# Patient Record
Sex: Female | Born: 1950 | Race: White | Hispanic: No | Marital: Married | Smoking: Former smoker
Health system: Southern US, Community
[De-identification: ages and names within clinical notes are randomized; demographics above are authoritative.]

## PROBLEM LIST (undated history)

## (undated) DIAGNOSIS — Z860100 Personal history of colon polyps, unspecified: Secondary | ICD-10-CM

## (undated) DIAGNOSIS — F17200 Nicotine dependence, unspecified, uncomplicated: Secondary | ICD-10-CM

## (undated) DIAGNOSIS — E079 Disorder of thyroid, unspecified: Secondary | ICD-10-CM

## (undated) DIAGNOSIS — F331 Major depressive disorder, recurrent, moderate: Secondary | ICD-10-CM

## (undated) DIAGNOSIS — I10 Essential (primary) hypertension: Secondary | ICD-10-CM

## (undated) DIAGNOSIS — E559 Vitamin D deficiency, unspecified: Secondary | ICD-10-CM

## (undated) HISTORY — DX: Personal history of colon polyps, unspecified: Z86.0100

## (undated) HISTORY — DX: Vitamin D deficiency, unspecified: E55.9

## (undated) HISTORY — DX: Essential (primary) hypertension: I10

## (undated) HISTORY — DX: Nicotine dependence, unspecified, uncomplicated: F17.200

## (undated) HISTORY — DX: Major depressive disorder, recurrent, moderate: F33.1

## (undated) HISTORY — DX: Personal history of colonic polyps: Z86.010

## (undated) HISTORY — DX: Disorder of thyroid, unspecified: E07.9

---

## 2016-11-07 DIAGNOSIS — R748 Abnormal levels of other serum enzymes: Secondary | ICD-10-CM | POA: Insufficient documentation

## 2016-11-08 DIAGNOSIS — I10 Essential (primary) hypertension: Secondary | ICD-10-CM | POA: Insufficient documentation

## 2016-11-08 DIAGNOSIS — E559 Vitamin D deficiency, unspecified: Secondary | ICD-10-CM | POA: Insufficient documentation

## 2017-02-14 DIAGNOSIS — M810 Age-related osteoporosis without current pathological fracture: Secondary | ICD-10-CM | POA: Insufficient documentation

## 2017-07-09 DIAGNOSIS — K649 Unspecified hemorrhoids: Secondary | ICD-10-CM | POA: Insufficient documentation

## 2017-11-21 DIAGNOSIS — R1084 Generalized abdominal pain: Secondary | ICD-10-CM | POA: Insufficient documentation

## 2017-11-21 DIAGNOSIS — R194 Change in bowel habit: Secondary | ICD-10-CM | POA: Insufficient documentation

## 2017-11-21 DIAGNOSIS — K8681 Exocrine pancreatic insufficiency: Secondary | ICD-10-CM | POA: Insufficient documentation

## 2018-03-04 ENCOUNTER — Other Ambulatory Visit: Payer: Self-pay

## 2018-03-04 ENCOUNTER — Ambulatory Visit (INDEPENDENT_AMBULATORY_CARE_PROVIDER_SITE_OTHER): Payer: Medicare Other | Admitting: Psychiatry

## 2018-03-04 ENCOUNTER — Encounter: Payer: Self-pay | Admitting: Psychiatry

## 2018-03-04 VITALS — BP 137/76 | HR 76 | Temp 97.4°F | Ht 58.66 in | Wt 115.0 lb

## 2018-03-04 DIAGNOSIS — F411 Generalized anxiety disorder: Secondary | ICD-10-CM

## 2018-03-04 DIAGNOSIS — F3181 Bipolar II disorder: Secondary | ICD-10-CM

## 2018-03-04 DIAGNOSIS — F4010 Social phobia, unspecified: Secondary | ICD-10-CM | POA: Diagnosis not present

## 2018-03-04 MED ORDER — BUPROPION HCL ER (XL) 300 MG PO TB24: 300.0000 mg | ORAL_TABLET | Freq: Every day | ORAL | 1 refills | Status: DC

## 2018-03-04 MED ORDER — FLUOXETINE HCL 10 MG PO CAPS: 10.0000 mg | ORAL_CAPSULE | Freq: Every day | ORAL | 1 refills | Status: DC

## 2018-03-04 NOTE — Patient Instructions (Signed)
Living With Bipolar Disorder If you have been diagnosed with bipolar disorder, you may be relieved that you now know why you have felt or behaved a certain way. You may also feel overwhelmed about the treatment ahead, how to get the support you need, and how to deal with the condition day-to-day. With care and support, you can learn to manage your symptoms and live with bipolar disorder. How to manage lifestyle changes Managing stress Stress is your body's reaction to life changes and events, both good and bad. Stress can play a major role in bipolar disorder, so it is important to learn how to cope with stress. Some techniques to cope with stress include:  Meditation, muscle relaxation, and breathing exercises.  Exercise. Even a short daily walk can help to lower stress levels.  Getting enough good-quality sleep. Too little sleep can cause mania to start (can trigger mania).  Making a schedule to manage your time. Knowing your daily schedule can help to keep you from feeling overwhelmed by tasks and deadlines.  Spending time on hobbies that you enjoy.  Medicines Your health care provider may suggest certain medicines if he or she feels that they will help improve your condition. Avoid using caffeine, alcohol, and other substances that may prevent your medicines from working properly (may interact). It is also important to:  Talk with your pharmacist or health care provider about all the medicines that you take, their possible side effects, and which medicines are safe to take together.  Make it your goal to take part in all treatment decisions (shared decision-making). Ask about possible side effects of medicines that your health care provider recommends, and tell him or her how you feel about having those side effects. It is best if shared decision-making with your health care provider is part of your total treatment plan.  If you are taking medicines as part of your treatment, do not stop  taking medicines before you ask your health care provider if it is safe to stop. You may need to have the medicine slowly decreased (tapered) over time to decrease the risk of harmful side effects. Relationships Spend time with people that you trust and with whom you feel a sense of understanding and calm. Try to find friends or family members who make you feel safe and can help you control feelings of mania. Consider going to couples counseling, family education classes, or family therapy to:  Educate your loved ones about your condition and offer suggestions about how they can support you.  Help resolve conflicts.  Help develop communication skills in your relationships.  How to recognize changes in your condition Everyone responds differently to treatment for bipolar disorder. Some signs that your condition is improving include:  Leveling of your mood. You may have less anger and excitement about daily activities, and your low moods may not be as bad.  Your symptoms being less intense.  Feeling calm more often.  Thinking clearly.  Not experiencing consequences for extreme behavior.  Feeling like your life is settling down.  Your behavior seeming more normal to you and to other people.  Some signs that your condition may be getting worse include:  Sleep problems.  Moods cycling between deep lows and unusually high (excess) energy.  Extreme emotions.  More anger at loved ones.  Staying away from others (isolating yourself).  A feeling of power or superiority.  Completing a lot of tasks in a very short amount of time.  Unusual thoughts and behaviors.    Suicidal thoughts.  Where to find support Talking to others  Try making a list of the people you may want to tell about your condition, such as the people you trust most.  Plan what you are willing to talk about and what you do not want to discuss. Think about your needs ahead of time, and how your friends and family  members can support you.  Let your loved ones know when they can share advice and when you would just like them to listen.  Give your loved ones information about bipolar disorder, and encourage them to learn about the condition. Finances Not all insurance plans cover mental health care, so it is important to check with your insurance carrier. If paying for co-pays or counseling services is a problem, search for a local or county mental health care center. Public mental health care services may be offered there at a low cost or no cost when you are not able to see a private health care provider. If you are taking medicine for depression, you may be able to get the generic form, which may be less expensive than brand-name medicine. Some makers of prescription medicines also offer help to patients who cannot afford the medicines they need. Follow these instructions at home: Medicines  Take over-the-counter and prescription medicines only as told by your health care provider or pharmacist.  Ask your pharmacist what over-the-counter cold medicines you should avoid. Some medicines can make symptoms worse. General instructions  Ask for support from trusted family members or friends to make sure you stay on track with your treatment.  Keep a journal to write down your daily moods, medicines, sleep habits, and life events. This may help you have more success with your treatment.  Make and follow a routine for daily meal times. Eat healthy foods, such as whole grains, vegetables, and fresh fruit.  Try to go to sleep and wake up around the same time every day.  Keep all follow-up visits as told by your health care provider. This is important. Questions to ask your health care provider:  If you are taking medicines: ? How long do I need to take medicine? ? Are there any long-term side effects of my medicine? ? Are there any alternatives to taking medicine?  How would I benefit from  therapy?  How often should I follow up with a health care provider? Contact a health care provider if:  Your symptoms get worse or they do not get better with treatment. Get help right away if:  You have thoughts about harming yourself or others. If you ever feel like you may hurt yourself or others, or have thoughts about taking your own life, get help right away. You can go to your nearest emergency department or call:  Your local emergency services (911 in the [place]  A suicide crisis helpline, such as the National Suicide Prevention Lifeline at 1-800-273-8255. This is open 24-hours a day.  Summary  Learning ways to deal with stress can help to calm you and may also help your treatment work better.  There is a wide range of medicines that can help to treat bipolar disorder.  Having healthy relationships can help to make your moods more stable.  Contact a health care provider if your symptoms get worse or they do not get better with treatment. This information is not intended to replace advice given to you by your health care provider. Make sure you discuss any questions you have with your   health care provider. Document Released: 01/31/2017 Document Revised: 01/31/2017 Document Reviewed: 01/31/2017 Elsevier Interactive Patient Education  2018 [employer]. Bipolar 2 Disorder Bipolar 2 disorder is a mental health disorder in which a person has episodes of emotional highs (mania) and lows (depression). Bipolar 2 is different from other bipolar disorders because the manic episodes are not as high and do not last as long. This is called hypomania. People with bipolar 2 disorder usually go back and forth between hypomanic and depressive episodes. What are the causes? The cause of this condition is not known. What increases the risk? The following factors may make you more likely to develop this condition:  Having a family member with the disorder.  An imbalance of certain chemicals  in the brain (neurotransmitters).  Stress, such as a death, illness, or financial problems.  Certain conditions that affect the brain or spinal cord (neurologic conditions).  Brain injury (trauma).  Having another mental health disorder, such as: ? Obsessive compulsive disorder. ? Schizophrenia.  What are the signs or symptoms? Symptoms of hypomania include:  Very high self-esteem or self-confidence.  Decreased need for sleep.  Unusual talkativeness or feeling a need to keep talking. Speech may be very fast. It may seem like you cannot stop talking.  Racing thoughts or constant talking, with quick shifts between topics that may or may not be related (flight of ideas).  Decreased ability to focus or concentrate.  Increased purposeful activity, such as work, studies, or social activity.  Increased nonproductive activity. This could be pacing, squirming and fidgeting, or finger and toe tapping.  Impulsive behavior and poor judgment. This may result in high-risk activities, such as having unprotected sex or spending a lot of money.  Symptoms of depression include:  Feeling sad, hopeless, or helpless.  Frequent or uncontrollable crying.  Lack of feeling or caring about anything.  Sleeping too much.  Moving more slowly than usual.  Not being able to enjoy things you used to enjoy.  Desire to be alone all the time.  Feeling guilty or worthless.  Lack of energy or motivation.  Trouble concentrating or remembering.  Trouble making decisions.  Increased appetite.  Thoughts of death or desire to harm yourself.  How is this diagnosed? To diagnose bipolar 2 disorder, your health care provider may ask about your:  Emotional episodes.  Medical history.  Alcohol and drug use. This includes prescription medicines. Certain medical conditions and substances can cause symptoms that seem like bipolar disorder (secondary bipolar disorder).  How is this treated? Bipolar  2 disorder is a long-term (chronic) illness. It is best controlled with ongoing (continuous) treatment rather than being treated only when symptoms occur. Treatment may include:  Psychotherapy. Some forms of talk therapy, such as cognitive-behavioral therapy (CBT), can provide support, education, and guidance.  Coping strategies, such as journaling or relaxation exercises. Relaxation exercises include: ? Yoga. ? Meditation. ? Deep breathing.  Lifestyle changes, such as: ? Limiting alcohol and drug use. ? Exercising regularly. ? Getting plenty of sleep. ? Making healthy eating choices.  Medicine. Medicine can be prescribed by a health care provider who specializes in treating mental disorders (psychiatrist). ? Medicines called mood stabilizers are usually prescribed. ? If symptoms occur even while taking a mood stabilizer, other medicines may be added.  A combination of medicine, talk therapy, and coping methods is the best way to treat this condition. Follow these instructions at home: Activity  Return to your normal activities as told by your health care  provider.  Find activities that you enjoy, and make time to do them.  Exercise regularly as told by your health care provider. Lifestyle  Limit alcohol intake to no more than 1 drink a day for nonpregnant women and 2 drinks a day for men. One drink equals 12 oz of beer, 5 oz of wine, or 1 oz of hard liquor.  Follow a set schedule for eating and sleeping.  Eat a balanced diet that includes fresh fruits and vegetables, whole grains, low-fat dairy, and lean meats.  Get at least 7-8 hours of sleep each night. General instructions  Take over-the-counter and prescription medicines only as told by your health care provider.  Think about joining a support group. Your health care provider may be able to recommend a support group.  Talk with your family and loved ones about your treatment goals and how they can help.  Keep all  follow-up visits as told by your health care provider. This is important. Where to find more information: For more information about bipolar 2 disorder, visit the following websites:  [employer] on Mental Illness: www.nami.org  [place] of Mental Health: http://www.maynard.net/  Contact a health care provider if:  Your symptoms get worse.  You have side effects from your medicine, and they get worse.  You have trouble sleeping.  You have trouble doing daily activities.  You feel unsafe in your surroundings.  You are dealing with substance abuse. Get help right away if:  You have new symptoms.  You have thoughts about harming yourself or others.  You harm yourself. Summary  Bipolar 2 disorder is a mental health disorder in which a person has episodes of hypomania and depression.  Bipolar 2 is best treated through a combination of medicines, talk therapy, and coping strategies.  Talk with your family and loved ones about your treatment goals and how they can help. This information is not intended to replace advice given to you by your health care provider. Make sure you discuss any questions you have with your health care provider. Document Released: 11/06/2016 Document Revised: 11/06/2016 Document Reviewed: 11/06/2016 Elsevier Interactive Patient Education  [employer].
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
## 2018-03-25 ENCOUNTER — Encounter: Payer: Self-pay | Admitting: Psychiatry

## 2018-03-25 ENCOUNTER — Ambulatory Visit (INDEPENDENT_AMBULATORY_CARE_PROVIDER_SITE_OTHER): Payer: Medicare Other | Admitting: Psychiatry

## 2018-03-25 ENCOUNTER — Other Ambulatory Visit: Payer: Self-pay

## 2018-03-25 VITALS — BP 165/73 | HR 72 | Temp 98.9°F | Wt 116.8 lb

## 2018-03-25 DIAGNOSIS — F411 Generalized anxiety disorder: Secondary | ICD-10-CM | POA: Diagnosis not present

## 2018-03-25 DIAGNOSIS — F3181 Bipolar II disorder: Secondary | ICD-10-CM | POA: Diagnosis not present

## 2018-03-25 DIAGNOSIS — F4010 Social phobia, unspecified: Secondary | ICD-10-CM | POA: Diagnosis not present

## 2018-03-25 DIAGNOSIS — F17200 Nicotine dependence, unspecified, uncomplicated: Secondary | ICD-10-CM

## 2018-03-25 MED ORDER — FLUOXETINE HCL 10 MG PO CAPS: 10.0000 mg | ORAL_CAPSULE | Freq: Every day | ORAL | 1 refills | Status: DC

## 2018-03-25 MED ORDER — TRAZODONE HCL 50 MG PO TABS: 25.0000 mg | ORAL_TABLET | Freq: Every evening | ORAL | 2 refills | Status: DC | PRN

## 2018-03-25 NOTE — Patient Instructions (Signed)
Trazodone tablets What is this medicine? TRAZODONE (TRAZ oh done) is used to treat depression. This medicine may be used for other purposes; ask your health care provider or pharmacist if you have questions. COMMON BRAND NAME(S): Desyrel What should I tell my health care provider before I take this medicine? They need to know if you have any of these conditions: -attempted suicide or thinking about it -bipolar disorder -bleeding problems -glaucoma -heart disease, or previous heart attack -irregular heart beat -kidney or liver disease -low levels of sodium in the blood -an unusual or allergic reaction to trazodone, other medicines, foods, dyes or preservatives -pregnant or trying to get pregnant -breast-feeding How should I use this medicine? Take this medicine by mouth with a glass of water. Follow the directions on the prescription label. Take this medicine shortly after a meal or a light snack. Take your medicine at regular intervals. Do not take your medicine more often than directed. Do not stop taking this medicine suddenly except upon the advice of your doctor. Stopping this medicine too quickly may cause serious side effects or your condition may worsen. A special MedGuide will be given to you by the pharmacist with each prescription and refill. Be sure to read this information carefully each time. Talk to your pediatrician regarding the use of this medicine in children. Special care may be needed. Overdosage: If you think you have taken too much of this medicine contact a poison control center or emergency room at once. NOTE: This medicine is only for you. Do not share this medicine with others. What if I miss a dose? If you miss a dose, take it as soon as you can. If it is almost time for your next dose, take only that dose. Do not take double or extra doses. What may interact with this medicine? Do not take this medicine with any of the following medications: -certain medicines  for fungal infections like fluconazole, itraconazole, ketoconazole, posaconazole, voriconazole -cisapride -dofetilide -dronedarone -linezolid -MAOIs like Carbex, Eldepryl, Marplan, Nardil, and Parnate -mesoridazine -methylene blue (injected into a vein) -pimozide -saquinavir -thioridazine -ziprasidone This medicine may also interact with the following medications: -alcohol -antiviral medicines for HIV or AIDS -aspirin and aspirin-like medicines -barbiturates like phenobarbital -certain medicines for blood pressure, heart disease, irregular heart beat -certain medicines for depression, anxiety, or psychotic disturbances -certain medicines for migraine headache like almotriptan, eletriptan, frovatriptan, naratriptan, rizatriptan, sumatriptan, zolmitriptan -certain medicines for seizures like carbamazepine and phenytoin -certain medicines for sleep -certain medicines that treat or prevent blood clots like dalteparin, enoxaparin, warfarin -digoxin -fentanyl -lithium -NSAIDS, medicines for pain and inflammation, like ibuprofen or naproxen -other medicines that prolong the QT interval (cause an abnormal heart rhythm) -rasagiline -supplements like St. John's wort, kava kava, valerian -tramadol -tryptophan This list may not describe all possible interactions. Give your health care provider a list of all the medicines, herbs, non-prescription drugs, or dietary supplements you use. Also tell them if you smoke, drink alcohol, or use illegal drugs. Some items may interact with your medicine. What should I watch for while using this medicine? Tell your doctor if your symptoms do not get better or if they get worse. Visit your doctor or health care professional for regular checks on your progress. Because it may take several weeks to see the full effects of this medicine, it is important to continue your treatment as prescribed by your doctor. Patients and their families should watch out for new  or worsening thoughts of suicide or depression. Also   watch out for sudden changes in feelings such as feeling anxious, agitated, panicky, irritable, hostile, aggressive, impulsive, severely restless, overly excited and hyperactive, or not being able to sleep. If this happens, especially at the beginning of treatment or after a change in dose, call your health care professional. You may get drowsy or dizzy. Do not drive, use machinery, or do anything that needs mental alertness until you know how this medicine affects you. Do not stand or sit up quickly, especially if you are an older patient. This reduces the risk of dizzy or fainting spells. Alcohol may interfere with the effect of this medicine. Avoid alcoholic drinks. This medicine may cause dry eyes and blurred vision. If you wear contact lenses you may feel some discomfort. Lubricating drops may help. See your eye doctor if the problem does not go away or is severe. Your mouth may get dry. Chewing sugarless gum, sucking hard candy and drinking plenty of water may help. Contact your doctor if the problem does not go away or is severe. What side effects may I notice from receiving this medicine? Side effects that you should report to your doctor or health care professional as soon as possible: -allergic reactions like skin rash, itching or hives, swelling of the face, lips, or tongue -elevated mood, decreased need for sleep, racing thoughts, impulsive behavior -confusion -fast, irregular heartbeat -feeling faint or lightheaded, falls -feeling agitated, angry, or irritable -loss of balance or coordination -painful or prolonged erections -restlessness, pacing, inability to keep still -suicidal thoughts or other mood changes -tremors -trouble sleeping -seizures -unusual bleeding or bruising Side effects that usually do not require medical attention (report to your doctor or health care professional if they continue or are bothersome): -change in  sex drive or performance -change in appetite or weight -constipation -headache -muscle aches or pains -nausea This list may not describe all possible side effects. Call your doctor for medical advice about side effects. You may report side effects to FDA at 1-800-FDA-1088. Where should I keep my medicine? Keep out of the reach of children. Store at room temperature between 15 and 30 degrees C (59 to 86 degrees F). Protect from light. Keep container tightly closed. Throw away any unused medicine after the expiration date. NOTE: This sheet is a summary. It may not cover all possible information. If you have questions about this medicine, talk to your doctor, pharmacist, or health care provider.  2018 Elsevier/Gold Standard (2016-03-01 16:57:05)  

## 2018-03-25 NOTE — Progress Notes (Signed)
[facility] MD OP Progress Note  03/25/2018 10:57 AM Marletta Bousquet  MRN:  [id number]  Chief Complaint: ' I am here for follow up." Chief Complaint    Follow-up; Medication Refill     HPI: Julie Harrison is a 67 year old Caucasian female, engaged, retired, lives in [place], has a history of depression, anxiety, hypothyroidism, hypertension, presented to the clinic today for a follow-up visit.   Patient today reports that she is currently doing okay on the current medication regimen.  She is on a small dose of Prozac along with Wellbutrin.  She reports the medications are helping her mood symptoms .  Denies any side effects to the Prozac.  She reports she wants to stay on that dose and does not want any medication readjustment today.  She does struggle with sleep issues.  She however reports it is very occasional and does not happen all the time.  She also is worried about starting any new medications at this time.  Discussed trazodone with patient.  Discussed with patient that she can use it as needed.  She continues to struggle with smoking cigarettes.  She reports she recently started using nicotine lozenges.  She however is worried that it may be giving her side effects like racing heart rate and elevated blood pressure.  Her blood pressure seems to be elevated today.  She however reports it is random and does not happen all the time.  She will reach out to her PMD if it stays elevated.  Discussed with patient about pursuing psychotherapy.  She has an appointment with Ms. Nolon Rod soon.   Visit Diagnosis:    ICD-10-CM   1. Bipolar 2 disorder, major depressive episode (HCC) F31.81   2. GAD (generalized anxiety disorder) F41.1   3. Social anxiety disorder F40.10   4. Tobacco use disorder F17.200     Past Psychiatric History: I have reviewed past psychiatric history from my progress note on 03/04/2018.  Past trials of Viibryd, Brintellix, duloxetine, Pristiq, Zoloft, Lexapro, Latuda,  Prozac-higher dose made her restless.  Past Medical History:  Past Medical History:  Diagnosis Date  . Hypertension   . 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 Personal history of colonic polyps   . Thyroid disease   . Tobacco dependence   . Vitamin D deficiency    History reviewed. No pertinent surgical history.  Family Psychiatric History: I have reviewed family psychiatric history from my progress note on 03/04/2018.  Family History:  Family History  Problem Relation Age of Onset  . Depression Mother   . Alcohol abuse Father   . Alcohol abuse Paternal Uncle   . Depression Maternal Grandmother    Substance abuse history: Denies  Social History: Patient is divorced.  Patient is currently engaged with her boyfriend.  She lives in [place].  She is retired.  She used to work in social services in the past.  She has one daughter living.  That daughter was given away for adoption several years ago since patient had her very young.  However her daughter reached out to her 30 years ago and they have a good relationship with each other at this time. One of her daughters passed away from seizure disorder which happened 3 years ago.  Patient has several grandchildren.  Currently lives with her mother in [place]. Social History   Socioeconomic History  . Marital status: Divorced    Spouse name: Not on file  .  Number of children: 2  . Years of education: Not on file  . Highest education level: Associate degree: occupational, Scientist, product/process development, or vocational program  Occupational History    Comment: retired  Engineer, production  . Financial resource strain: Not hard at all  . Food insecurity:    Worry: Never true    Inability: Never true  . Transportation needs:    Medical: No    Non-medical: No  Tobacco Use  . Smoking status: Current Every Day Smoker    Packs/day: 0.25    Types: Cigarettes  . Smokeless tobacco: Never Used  Substance and  Sexual Activity  . Alcohol use: Yes    Alcohol/week: 1.2 oz    Types: 2 Glasses of wine per week  . Drug use: Yes    Types: Marijuana    Comment: about a month ago  . Sexual activity: Yes  Lifestyle  . Physical activity:    Days per week: 0 days    Minutes per session: 0 min  . Stress: Not at all  Relationships  . Social connections:    Talks on phone: More than three times a week    Gets together: Once a week    Attends religious service: More than 4 times per year    Active member of club or organization: No    Attends meetings of clubs or organizations: Never    Relationship status: Divorced  Other Topics Concern  . Not on file  Social History Narrative  . Not on file    Allergies:  Allergies  Allergen Reactions  . Hydralazine Hcl Itching  . Erythromycin Nausea Only  . Hydrochlorothiazide Itching  . Vortioxetine Nausea Only    Metabolic Disorder Labs: No results found for: HGBA1C, MPG No results found for: PROLACTIN No results found for: CHOL, TRIG, HDL, CHOLHDL, VLDL, LDLCALC No results found for: TSH  Therapeutic Level Labs: No results found for: LITHIUM No results found for: VALPROATE No components found for:  CBMZ  Current Medications: Current Outpatient Medications  Medication Sig Dispense Refill  . alendronate (FOSAMAX) 70 MG tablet Take by mouth.    Marland Kitchen amoxicillin (AMOXIL) 500 MG capsule TAKE 2 CAPSULES BY MOUTH STAT, THEN 1 CAPSULE 3 TIMES DAILY UNTIL GONE  1  . buPROPion (WELLBUTRIN XL) 300 MG 24 hr tablet Take 1 tablet (300 mg total) by mouth daily. 90 tablet 1  . Calcium Carbonate-Vitamin D (OYSTER SHELL CALCIUM 500 + D) 500-125 MG-UNIT TABS Take by mouth.    Marland Kitchen FLUoxetine (PROZAC) 10 MG capsule Take 1 capsule (10 mg total) by mouth daily with breakfast. 90 capsule 1  . levothyroxine (SYNTHROID, LEVOTHROID) 100 MCG tablet Take 100 mcg by mouth daily before breakfast.    . lisinopril (PRINIVIL,ZESTRIL) 5 MG tablet Take 5 mg by mouth daily. for high  blood pressure  1  . loratadine (CLARITIN) 10 MG tablet Take by mouth.    . traZODone (DESYREL) 50 MG tablet Take 0.5-1 tablets (25-50 mg total) by mouth at bedtime as needed for sleep. 30 tablet 2   No current facility-administered medications for this visit.      Musculoskeletal: Strength & Muscle Tone: within normal limits Gait & Station: normal Patient leans: N/A  Psychiatric Specialty Exam: Review of Systems  Psychiatric/Behavioral: The patient is nervous/anxious and has insomnia.   All other systems reviewed and are negative.   Blood pressure (!) 165/73, pulse 72, temperature 98.9 F (37.2 C), temperature source Oral, weight 116 lb 12.8 oz (53 kg).Body  mass index is 23.86 kg/m.  General Appearance: Casual  Eye Contact:  Fair  Speech:  Normal Rate  Volume:  Normal  Mood:  Anxious  Affect:  Congruent  Thought Process:  Goal Directed and Descriptions of Associations: Intact  Orientation:  Full (Time, Place, and Person)  Thought Content: Logical   Suicidal Thoughts:  No  Homicidal Thoughts:  No  Memory:  Immediate;   Fair Recent;   Fair Remote;   Fair  Judgement:  Fair  Insight:  Fair  Psychomotor Activity:  Normal  Concentration:  Concentration: Fair and Attention Span: Fair  Recall:  FiservFair  Fund of Knowledge: Fair  Language: Fair  Akathisia:  No  Handed:  Right  AIMS (if indicated): na  Assets:  Communication Skills Desire for Improvement Social Support  ADL's:  Intact  Cognition: WNL  Sleep:  varies   Screenings:   Assessment and Plan: Julie BeechamCynthia is a 67 year old Caucasian female who has a history of mood symptoms, hypothyroidism, hypertension, presented to the clinic today for a follow-up visit.  Patient has several psychosocial stressors including the death of her daughter from seizure disorder 3 years ago, recent engagement with her boyfriend, history of trauma growing up.  Patient currently is tolerating the medications well and will start psychotherapy  soon.  Plan as noted below.  Plan Bipolar disorder type II Patient completed a mood disorder questionnaire last visit and scored high on the same. We will continue Wellbutrin 300 mg p.o. daily, patient reports she wants to stay on the same Continue Prozac 10 mg p.o. daily. Discussed mood stabilizer, however patient reports she is not ready at this time and want to give these medications more time.  For GAD Prozac 10 mg p.o. daily  For social anxiety disorder Prozac 10 mg p.o. daily Patient will start psychotherapy with Ms. Peacock next week.  Tobacco use disorder She is trying to quit.  She is trying nicotine lozenges at this time.  She also has Wellbutrin.  Patient with elevated blood pressure will monitor her symptoms and follow-up with her PMD.  Follow-up in clinic in 6-8 weeks or sooner if needed.  More than 50 % of the time was spent for psychoeducation and supportive psychotherapy and care coordination.  This note was generated in part or whole with voice recognition software. Voice recognition is usually quite accurate but there are transcription errors that can and very often do occur. I apologize for any typographical errors that were not detected and corrected.       Jomarie LongsSaramma Kathleena Freeman, MD 03/25/2018, 10:57 AM

## 2018-04-07 ENCOUNTER — Ambulatory Visit: Payer: Self-pay | Admitting: Licensed Clinical Social Worker

## 2018-04-18 ENCOUNTER — Other Ambulatory Visit: Payer: Self-pay | Admitting: Psychiatry

## 2018-05-20 ENCOUNTER — Ambulatory Visit: Payer: Medicare Other | Admitting: Psychiatry

## 2018-05-28 ENCOUNTER — Ambulatory Visit: Payer: Medicare Other | Admitting: Psychiatry

## 2018-06-09 ENCOUNTER — Ambulatory Visit (INDEPENDENT_AMBULATORY_CARE_PROVIDER_SITE_OTHER): Payer: Medicare Other | Admitting: Licensed Clinical Social Worker

## 2018-06-09 DIAGNOSIS — F3181 Bipolar II disorder: Secondary | ICD-10-CM

## 2018-06-19 ENCOUNTER — Ambulatory Visit: Payer: Medicare Other | Admitting: Psychiatry

## 2018-06-23 ENCOUNTER — Ambulatory Visit (INDEPENDENT_AMBULATORY_CARE_PROVIDER_SITE_OTHER): Payer: Medicare Other | Admitting: Psychiatry

## 2018-06-23 ENCOUNTER — Other Ambulatory Visit: Payer: Self-pay

## 2018-06-23 ENCOUNTER — Encounter: Payer: Self-pay | Admitting: Psychiatry

## 2018-06-23 VITALS — BP 151/67 | HR 61 | Temp 98.0°F | Wt 117.0 lb

## 2018-06-23 DIAGNOSIS — F17200 Nicotine dependence, unspecified, uncomplicated: Secondary | ICD-10-CM

## 2018-06-23 DIAGNOSIS — F411 Generalized anxiety disorder: Secondary | ICD-10-CM

## 2018-06-23 DIAGNOSIS — F4010 Social phobia, unspecified: Secondary | ICD-10-CM | POA: Diagnosis not present

## 2018-06-23 DIAGNOSIS — F3181 Bipolar II disorder: Secondary | ICD-10-CM | POA: Diagnosis not present

## 2018-06-23 MED ORDER — BUPROPION HCL ER (XL) 150 MG PO TB24: 150.0000 mg | ORAL_TABLET | Freq: Every day | ORAL | 1 refills | Status: DC

## 2018-06-23 MED ORDER — LAMOTRIGINE 25 MG PO TABS
25.0000 mg | ORAL_TABLET | Freq: Every day | ORAL | 1 refills | Status: DC
Start: 2018-06-23 — End: 2018-07-16

## 2018-06-23 NOTE — Patient Instructions (Signed)
Lamotrigine tablets What is this medicine? LAMOTRIGINE (la MOE tri jeen) is used to control seizures in adults and children with epilepsy and Lennox-Gastaut syndrome. It is also used in adults to treat bipolar disorder. This medicine may be used for other purposes; ask your health care provider or pharmacist if you have questions. COMMON BRAND NAME(S): Lamictal What should I tell my health care provider before I take this medicine? They need to know if you have any of these conditions: -a history of depression or bipolar disorder -aseptic meningitis during prior use of lamotrigine -folate deficiency -kidney disease -liver disease -suicidal thoughts, plans, or attempt; a previous suicide attempt by you or a family member -an unusual or allergic reaction to lamotrigine or other seizure medications, other medicines, foods, dyes, or preservatives -pregnant or trying to get pregnant -breast-feeding How should I use this medicine? Take this medicine by mouth with a glass of water. Follow the directions on the prescription label. Do not chew these tablets. If this medicine upsets your stomach, take it with food or milk. Take your doses at regular intervals. Do not take your medicine more often than directed. A special MedGuide will be given to you by the pharmacist with each new prescription and refill. Be sure to read this information carefully each time. Talk to your pediatrician regarding the use of this medicine in children. While this drug may be prescribed for children as young as 2 years for selected conditions, precautions do apply. Overdosage: If you think you have taken too much of this medicine contact a poison control center or emergency room at once. NOTE: This medicine is only for you. Do not share this medicine with others. What if I miss a dose? If you miss a dose, take it as soon as you can. If it is almost time for your next dose, take only that dose. Do not take double or extra  doses. What may interact with this medicine? -carbamazepine -female hormones, including contraceptive or birth control pills -methotrexate -phenobarbital -phenytoin -primidone -pyrimethamine -rifampin -trimethoprim -valproic acid This list may not describe all possible interactions. Give your health care provider a list of all the medicines, herbs, non-prescription drugs, or dietary supplements you use. Also tell them if you smoke, drink alcohol, or use illegal drugs. Some items may interact with your medicine. What should I watch for while using this medicine? Visit your doctor or health care professional for regular checks on your progress. If you take this medicine for seizures, wear a Medic Alert bracelet or necklace. Carry an identification card with information about your condition, medicines, and doctor or health care professional. It is important to take this medicine exactly as directed. When first starting treatment, your dose will need to be adjusted slowly. It may take weeks or months before your dose is stable. You should contact your doctor or health care professional if your seizures get worse or if you have any new types of seizures. Do not stop taking this medicine unless instructed by your doctor or health care professional. Stopping your medicine suddenly can increase your seizures or their severity. Contact your doctor or health care professional right away if you develop a rash while taking this medicine. Rashes may be very severe and sometimes require treatment in the hospital. Deaths from rashes have occurred. Serious rashes occur more often in children than adults taking this medicine. It is more common for these serious rashes to occur during the first 2 months of treatment, but a rash can   occur at any time. You may get drowsy, dizzy, or have blurred vision. Do not drive, use machinery, or do anything that needs mental alertness until you know how this medicine affects you.  To reduce dizzy or fainting spells, do not sit or stand up quickly, especially if you are an older patient. Alcohol can increase drowsiness and dizziness. Avoid alcoholic drinks. If you are taking this medicine for bipolar disorder, it is important to report any changes in your mood to your doctor or health care professional. If your condition gets worse, you get mentally depressed, feel very hyperactive or manic, have difficulty sleeping, or have thoughts of hurting yourself or committing suicide, you need to get help from your health care professional right away. If you are a caregiver for someone taking this medicine for bipolar disorder, you should also report these behavioral changes right away. The use of this medicine may increase the chance of suicidal thoughts or actions. Pay special attention to how you are responding while on this medicine. Your mouth may get dry. Chewing sugarless gum or sucking hard candy, and drinking plenty of water may help. Contact your doctor if the problem does not go away or is severe. Women who become pregnant while using this medicine may enroll in the [place] Antiepileptic Drug Pregnancy Registry by calling 1-888-233-2334. This registry collects information about the safety of antiepileptic drug use during pregnancy. What side effects may I notice from receiving this medicine? Side effects that you should report to your doctor or health care professional as soon as possible: -allergic reactions like skin rash, itching or hives, swelling of the face, lips, or tongue -blurred or double vision -difficulty walking or controlling muscle movements -fever -headache, stiff neck, and sensitivity to light -painful sores in the mouth, eyes, or nose -redness, blistering, peeling or loosening of the skin, including inside the mouth -severe muscle pain -swollen lymph glands -uncontrollable eye movements -unusual bruising or bleeding -unusually weak or  tired -vomiting -worsening of mood, thoughts or actions of suicide or dying -yellowing of the eyes or skin Side effects that usually do not require medical attention (report to your doctor or health care professional if they continue or are bothersome): -diarrhea or constipation -difficulty sleeping -nausea -tremors This list may not describe all possible side effects. Call your doctor for medical advice about side effects. You may report side effects to FDA at 1-800-FDA-1088. Where should I keep my medicine? Keep out of reach of children. Store at room temperature between 15 and 30 degrees C (59 and 86 degrees F). Throw away any unused medicine after the expiration date. NOTE: This sheet is a summary. It may not cover all possible information. If you have questions about this medicine, talk to your doctor, pharmacist, or health care provider.  2018 Elsevier/Gold Standard (2015-11-03 09:29:40)  

## 2018-06-23 NOTE — Progress Notes (Signed)
BH MD OP Progress Note  06/23/2018 5:53 PM Julie Harrison  MRN:  [id number]  Chief Complaint: ' I am here for follow up." Chief Complaint    Follow-up; Medication Refill     HPI: Julie Harrison is a 67 year old Caucasian female, engaged, retired, lives in [place], has a history of depression, anxiety, hypothyroidism, hypertension, presented to the clinic today for a follow-up visit.  Patient today reports she has noticed some hypomanic symptoms like increased energy, multiple goal-directed activity and so on.  She wonders whether it is due to the addition of the Prozac to her Wellbutrin.  She was not ready to start a mood stabilizer last visit however she is more open to it right now.  Discussed readjusting her medication dosage today.  Discussed reducing her Wellbutrin and and adding Lamictal as a mood stabilizer.  Patient agrees with the same.  Patient currently denies any suicidality.  Patient denies any perceptual disturbances.  Patient reports she had some trouble with scheduling her therapy appointments.  Discussed with her that she can be referred to our new therapist and she agrees with plan.  Patient reports she is able to sleep well and does have trazodone as needed for those occasions when she needs it.  She continues to have good social support from her fianc.  She has quit smoking since of her last visit.  She continues to use nicotine lozenges.  Pt with elevated blood pressure today, reports that it may be because of her using nicotine lozenges right before coming into the clinic.  Patient advised to keep a log of her blood pressure and talk to her primary medical doctor. Visit Diagnosis:    ICD-10-CM   1. Bipolar 2 disorder, major depressive episode (HCC) F31.81 buPROPion (WELLBUTRIN XL) 150 MG 24 hr tablet    lamoTRIgine (LAMICTAL) 25 MG tablet  2. GAD (generalized anxiety disorder) F41.1   3. Social anxiety disorder F40.10   4. Tobacco use disorder F17.200    in remission     Past Psychiatric History: Have reviewed past psychiatric history from my progress note on 03/04/2018.  Past trials of Viibryd, Brintellix, duloxetine, Pristiq, Zoloft, Lexapro, Latuda, Prozac.  Past Medical History:  Past Medical History:  Diagnosis Date  . Hypertension   . 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 Personal history of colonic polyps   . Thyroid disease   . Tobacco dependence   . Vitamin D deficiency    History reviewed. No pertinent surgical history.  Family Psychiatric History: Reviewed family psychiatric history from my progress note on 03/04/2018  Family History:  Family History  Problem Relation Age of Onset  . Depression Mother   . Alcohol abuse Father   . Alcohol abuse Paternal Uncle   . Depression Maternal Grandmother     Social History: Reviewed social history from my progress note on 03/04/2018. Social History   Socioeconomic History  . Marital status: Divorced    Spouse name: Not on file  . Number of children: 2  . Years of education: Not on file  . Highest education level: Associate degree: occupational, Scientist, product/process development, or vocational program  Occupational History    Comment: retired  Engineer, production  . Financial resource strain: Not hard at all  . Food insecurity:    Worry: Never true    Inability: Never true  . Transportation needs:    Medical: No    Non-medical: No  Tobacco Use  . Smoking status:  Current Every Day Smoker    Packs/day: 0.25    Types: Cigarettes  . Smokeless tobacco: Never Used  Substance and Sexual Activity  . Alcohol use: Yes    Alcohol/week: 2.0 standard drinks    Types: 2 Glasses of wine per week  . Drug use: Yes    Types: Marijuana    Comment: about a month ago  . Sexual activity: Yes  Lifestyle  . Physical activity:    Days per week: 0 days    Minutes per session: 0 min  . Stress: Not at all  Relationships  . Social connections:    Talks on phone: More than  three times a week    Gets together: Once a week    Attends religious service: More than 4 times per year    Active member of club or organization: No    Attends meetings of clubs or organizations: Never    Relationship status: Divorced  Other Topics Concern  . Not on file  Social History Narrative  . Not on file    Allergies:  Allergies  Allergen Reactions  . Hydralazine Hcl Itching  . Erythromycin Nausea Only  . Hydrochlorothiazide Itching  . Vortioxetine Nausea Only    Metabolic Disorder Labs: No results found for: HGBA1C, MPG No results found for: PROLACTIN No results found for: CHOL, TRIG, HDL, CHOLHDL, VLDL, LDLCALC No results found for: TSH  Therapeutic Level Labs: No results found for: LITHIUM No results found for: VALPROATE No components found for:  CBMZ  Current Medications: Current Outpatient Medications  Medication Sig Dispense Refill  . alendronate (FOSAMAX) 70 MG tablet Take by mouth.    Marland Kitchen amoxicillin (AMOXIL) 500 MG capsule TAKE 2 CAPSULES BY MOUTH STAT, THEN 1 CAPSULE 3 TIMES DAILY UNTIL GONE  1  . Calcium Carbonate-Vitamin D (OYSTER SHELL CALCIUM 500 + D) 500-125 MG-UNIT TABS Take by mouth.    Marland Kitchen FLUoxetine (PROZAC) 10 MG capsule Take 1 capsule (10 mg total) by mouth daily with breakfast. 90 capsule 1  . levothyroxine (SYNTHROID, LEVOTHROID) 100 MCG tablet Take 100 mcg by mouth daily before breakfast.    . lisinopril (PRINIVIL,ZESTRIL) 5 MG tablet Take 5 mg by mouth daily. for high blood pressure  1  . loratadine (CLARITIN) 10 MG tablet Take by mouth.    . traZODone (DESYREL) 50 MG tablet TAKE 1/2 TO 1 TABLET (25-50 MG TOTAL) BY MOUTH AT BEDTIME AS NEEDED FOR SLEEP. 90 tablet 1  . buPROPion (WELLBUTRIN XL) 150 MG 24 hr tablet Take 1 tablet (150 mg total) by mouth daily. 90 tablet 1  . lamoTRIgine (LAMICTAL) 25 MG tablet Take 1 tablet (25 mg total) by mouth daily. 30 tablet 1   No current facility-administered medications for this visit.       Musculoskeletal: Strength & Muscle Tone: within normal limits Gait & Station: normal Patient leans: N/A  Psychiatric Specialty Exam: Review of Systems  Psychiatric/Behavioral: The patient is nervous/anxious.   All other systems reviewed and are negative.   Blood pressure (!) 151/67, pulse 61, temperature 98 F (36.7 C), temperature source Oral, weight 117 lb (53.1 kg).Body mass index is 23.91 kg/m.  General Appearance: Casual  Eye Contact:  Fair  Speech:  Normal Rate  Volume:  Normal  Mood:  Anxious  Affect:  Congruent  Thought Process:  Goal Directed and Descriptions of Associations: Intact  Orientation:  Full (Time, Place, and Person)  Thought Content: Logical   Suicidal Thoughts:  No  Homicidal Thoughts:  No  Memory:  Immediate;   Fair Recent;   Fair Remote;   Fair  Judgement:  Fair  Insight:  Fair  Psychomotor Activity:  Normal  Concentration:  Concentration: Fair and Attention Span: Fair  Recall:  [employer] of Knowledge: Fair  Language: Fair  Akathisia:  No  Handed:  Right  AIMS (if indicated): na  Assets:  Communication Skills Desire for Improvement Social Support  ADL's:  Intact  Cognition: WNL  Sleep:  Fair   Screenings:   Assessment and Plan: Shaena is a 67 year old Caucasian female who has a history of mood symptoms, hypothyroidism, hypertension, presented to the clinic today for a follow-up visit.  Patient with several psychosocial stressors including the death of her daughter from seizure disorder 3 years ago, recent engagement with her boyfriend, history of trauma growing up and so on.  Patient continues to struggle with some mood symptoms and hence discussed medication readjustment.  She will also start psychotherapy.  Plan as noted below.  Plan Bipolar disorder type II Reduce Wellbutrin to 150 mg p.o. daily Continue Prozac 10 mg p.o. daily Add Lamictal 25 mg p.o. Daily.  GAD Prozac 10 mg p.o. daily  For anxiety disorder-social  anxiety Prozac 10 mg p.o. daily Patient to be referred for psychotherapy.  Tobacco use disorder in remission She will continue to use nicotine replacement.  Patient with elevated blood pressure-she will follow-up with her primary medical doctor.  Follow-up in clinic in 3 weeks or sooner if needed.  More than 50 % of the time was spent for psychoeducation and supportive psychotherapy and care coordination.  This note was generated in part or whole with voice recognition software. Voice recognition is usually quite accurate but there are transcription errors that can and very often do occur. I apologize for any typographical errors that were not detected and corrected.       Jomarie Longs, MD 06/23/2018, 5:53 PM

## 2018-06-25 ENCOUNTER — Ambulatory Visit (INDEPENDENT_AMBULATORY_CARE_PROVIDER_SITE_OTHER): Payer: Medicare Other | Admitting: Licensed Clinical Social Worker

## 2018-06-25 ENCOUNTER — Encounter: Payer: Self-pay | Admitting: Licensed Clinical Social Worker

## 2018-06-25 DIAGNOSIS — F411 Generalized anxiety disorder: Secondary | ICD-10-CM
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
## 2018-07-04 ENCOUNTER — Encounter: Payer: Self-pay | Admitting: Licensed Clinical Social Worker

## 2018-07-04 ENCOUNTER — Ambulatory Visit (INDEPENDENT_AMBULATORY_CARE_PROVIDER_SITE_OTHER): Payer: Medicare Other | Admitting: Licensed Clinical Social Worker

## 2018-07-04 DIAGNOSIS — F411 Generalized anxiety disorder: Secondary | ICD-10-CM | POA: Diagnosis not present
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
## 2018-07-11 ENCOUNTER — Ambulatory Visit (INDEPENDENT_AMBULATORY_CARE_PROVIDER_SITE_OTHER): Payer: Medicare Other | Admitting: Licensed Clinical Social Worker

## 2018-07-11 ENCOUNTER — Encounter: Payer: Self-pay | Admitting: Licensed Clinical Social Worker

## 2018-07-11 DIAGNOSIS — F411 Generalized anxiety disorder: Secondary | ICD-10-CM | POA: Diagnosis not present

## 2018-07-11 NOTE — Progress Notes (Addendum)
   THERAPIST PROGRESS NOTE  Session Time: 0900-1000  Participation Level: Active  Behavioral Response: NeatAlertAnxious  Type of Therapy: Individual Therapy  Treatment Goals addressed: Coping  Interventions: Supportive  Summary: Julie Harrison is a 67 y.o. female who presents with Bipolar 2 Disorder and GAD. Pt reports feeling much more able to handle her emotions since starting therapy. She reports improvements in setting boundaries with her boyfriend, and reports she was not previously able to do that.Julie Harrison described her initial hesitancy with beginning a relationship with Julie Harrison, since he knew her daughter who has since passed away. We discussed ways to reframe that experience into something more positive.  She states that her primary concern right now is handling how to navigate a friendship with someone she had distanced herself from, who has recently reappeared. Pt's boyfriend is not comfortable with the friendship, and Julie Harrison is struggling with how to handle this situation. She reports her friend, Julie Harrison, has had ongoing struggles with substances and she feels she's "gotten into situations with him that she wouldn't have otherwise." Pt's boyfriend reports feeling this is a romantic relationship; however, Julie Harrison is gay. Julie Harrison reports feeling very conflicted about cutting Julie Harrison off, but feels she has been enabling him throughout the years.     Suicidal/Homicidal: Pt denies.   Therapist Response: Julie Harrison demonstrates wonderful insight and clear communication skills. She is able to reflect our on sessions and bring things to the next session that she needs assistance with.   Plan: Return again in 1 weeks.  Diagnosis: Axis I: Generalized Anxiety Disorder    Axis II: No diagnosis    Heidi Dach, LCSW 07/11/2018

## 2018-07-14 ENCOUNTER — Ambulatory Visit: Payer: Medicare Other | Admitting: Psychiatry

## 2018-07-16 ENCOUNTER — Other Ambulatory Visit: Payer: Self-pay | Admitting: Psychiatry

## 2018-07-16 DIAGNOSIS — F3181 Bipolar II disorder: Secondary | ICD-10-CM

## 2018-07-18 ENCOUNTER — Ambulatory Visit: Payer: Medicare Other | Admitting: Licensed Clinical Social Worker

## 2018-07-25 ENCOUNTER — Encounter: Payer: Self-pay | Admitting: Licensed Clinical Social Worker

## 2018-07-25 ENCOUNTER — Ambulatory Visit (INDEPENDENT_AMBULATORY_CARE_PROVIDER_SITE_OTHER): Payer: Medicare Other | Admitting: Licensed Clinical Social Worker

## 2018-07-25 DIAGNOSIS — F411 Generalized anxiety disorder: Secondary | ICD-10-CM | POA: Diagnosis not present

## 2018-07-25 NOTE — Progress Notes (Signed)
   THERAPIST PROGRESS NOTE  Session Time: 0900-1000  Participation Level: Active  Behavioral Response: NeatAlertAnxious  Type of Therapy: Individual Therapy  Treatment Goals addressed: Anxiety  Interventions: CBT  Summary: Reika Callanan is a 67 y.o. female who presents with symptoms of anxiety and depression. Aiysha reports she has been having a lot of difficulty setting boundaries in her relationship. She discussed how her partner, Everardo All, goes through her phone and doesn't like when she goes places without him. We discussed how this made her feel, and then discussed what Buster's possible motivations could be. We then discussed various family dynamics that are contributing to her emotional distress around certain situations, primarily her living with her mother. We discussed behavioral patterns from Rachell's past that support her feelings of confinement as a result of her partner's actions. We also processed the conflict about her friend from her past, Iantha Fallen, and how that has added to her emotional state in the last two weeks. River reports still not knowing what she should do, and we discussed how there are not just two options, and that she needs to do what feels right to her. We discussed what that looked like and ways she could arrive at her decision within that relationship.   Suicidal/Homicidal: No  Therapist Response: Haliegh continues to show insight and is able to recognize connections to her childhood and her current situations. Sya continues to show improvements when regulating her emotions as well. We will continue to work on building self-esteem, communication, and setting boundaries in the next weeks.   Plan: Return again in 1 weeks.  Diagnosis: Axis I: Generalized Anxiety Disorder    Axis II: No diagnosis    Heidi Dach, LCSW 07/25/2018

## 2018-07-29 ENCOUNTER — Encounter: Payer: Self-pay | Admitting: Psychiatry

## 2018-07-29 ENCOUNTER — Ambulatory Visit (INDEPENDENT_AMBULATORY_CARE_PROVIDER_SITE_OTHER): Payer: Medicare Other | Admitting: Psychiatry

## 2018-07-29 ENCOUNTER — Other Ambulatory Visit: Payer: Self-pay

## 2018-07-29 VITALS — BP 137/79 | HR 66 | Temp 97.5°F | Wt 119.6 lb

## 2018-07-29 DIAGNOSIS — F17201 Nicotine dependence, unspecified, in remission: Secondary | ICD-10-CM

## 2018-07-29 DIAGNOSIS — F4010 Social phobia, unspecified: Secondary | ICD-10-CM | POA: Diagnosis not present

## 2018-07-29 DIAGNOSIS — F3181 Bipolar II disorder: Secondary | ICD-10-CM | POA: Diagnosis not present

## 2018-07-29 DIAGNOSIS — F411 Generalized anxiety disorder: Secondary | ICD-10-CM | POA: Diagnosis not present

## 2018-07-29 MED ORDER — BUPROPION HCL ER (XL) 300 MG PO TB24
300.0000 mg | ORAL_TABLET | Freq: Every day | ORAL | 1 refills | Status: DC
Start: 2018-07-29 — End: 2019-02-03

## 2018-07-29 NOTE — Progress Notes (Signed)
BH MD OP Progress Note  07/29/2018 12:52 PM [name]  MRN:  [id number]  Chief Complaint:' I am here for follow up visit."  Chief Complaint    Follow-up; Medication Refill     HPI: Julie Harrison is a 67 year old Caucasian female, engaged, retired, lives in [place], has a history of depression, anxiety, hypothyroidism, hypertension, presented to the clinic today for a follow-up visit.  Patient today reports she has not been taking her Lamictal as prescribed.  She reports she took it for a few days and felt like it was making her drowsy as well as nauseous.  She reports she was also worried about being on a new medication and hence decided to stop it.  She reports she eventually went back to her Wellbutrin 300 mg.  She reports she feels much better than her last visit with Clinical research associate.  She reports she has started psychotherapy sessions with Adelina Mings our therapist which has been going well.  She meets her on a weekly basis.  She reports she continues to want to be in therapy and want to see if that will help her prior to adding more medications.  She denies any suicidality or perceptual disturbances.  She reports sleep is good.  She denies any other concerns today. Visit Diagnosis:    ICD-10-CM   1. Bipolar 2 disorder, major depressive episode (HCC) F31.81 buPROPion (WELLBUTRIN XL) 300 MG 24 hr tablet   IMPROVING  2. GAD (generalized anxiety disorder) F41.1   3. Social anxiety disorder F40.10   4. 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  Past trials of Viibryd,brintellix, duloxetine, Pristiq, Zoloft, Lexapro, Latuda, Prozac.  Past Medical History:  Past Medical History:  Diagnosis Date  . Hypertension   . 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 Personal history of colonic polyps   . Thyroid disease   . Tobacco  dependence   . Vitamin D deficiency    History reviewed. No pertinent surgical history.  Family Psychiatric History: Reviewed family psychiatric history from my progress note on 03/04/2018  Family History:  Family History  Problem Relation Age of Onset  . Depression Mother   . Alcohol abuse Father   . Alcohol abuse Paternal Uncle   . Depression Maternal Grandmother     Social History: Reviewed social history from my progress note on 03/04/2018. Social History   Socioeconomic History  . Marital status: Divorced    Spouse name: Not on file  . Number of children: 2  . Years of education: Not on file  . Highest education level: Associate degree: occupational, Scientist, product/process development, or vocational program  Occupational History    Comment: retired  Engineer, production  . Financial resource strain: Not hard at all  . Food insecurity:    Worry: Never true    Inability: Never true  . Transportation needs:    Medical: No    Non-medical: No  Tobacco Use  . Smoking status: Former Smoker    Packs/day: 0.25    Types: Cigarettes    Last attempt to quit: 03/22/2018    Years since quitting: 0.3  . Smokeless tobacco: Never Used  Substance and Sexual Activity  . Alcohol use: Yes    Alcohol/week: 2.0 standard drinks    Types: 2 Glasses of wine per week  . Drug use: Yes    Types: Marijuana  Comment: about a month ago  . Sexual activity: Yes  Lifestyle  . Physical activity:    Days per week: 0 days    Minutes per session: 0 min  . Stress: Not at all  Relationships  . Social connections:    Talks on phone: More than three times a week    Gets together: Once a week    Attends religious service: More than 4 times per year    Active member of club or organization: No    Attends meetings of clubs or organizations: Never    Relationship status: Divorced  Other Topics Concern  . Not on file  Social History Narrative  . Not on file    Allergies:  Allergies  Allergen Reactions  . Hydralazine Hcl  Itching  . Erythromycin Nausea Only  . Hydrochlorothiazide Itching  . Vortioxetine Nausea Only    Metabolic Disorder Labs: No results found for: HGBA1C, MPG No results found for: PROLACTIN No results found for: CHOL, TRIG, HDL, CHOLHDL, VLDL, LDLCALC No results found for: TSH  Therapeutic Level Labs: No results found for: LITHIUM No results found for: VALPROATE No components found for:  CBMZ  Current Medications: Current Outpatient Medications  Medication Sig Dispense Refill  . alendronate (FOSAMAX) 70 MG tablet Take by mouth.    Marland Kitchen amoxicillin (AMOXIL) 500 MG capsule TAKE 2 CAPSULES BY MOUTH STAT, THEN 1 CAPSULE 3 TIMES DAILY UNTIL GONE  1  . Calcium Carbonate-Vitamin D (OYSTER SHELL CALCIUM 500 + D) 500-125 MG-UNIT TABS Take by mouth.    Marland Kitchen FLUoxetine (PROZAC) 10 MG capsule Take 1 capsule (10 mg total) by mouth daily with breakfast. 90 capsule 1  . lisinopril (PRINIVIL,ZESTRIL) 5 MG tablet Take 5 mg by mouth daily. for high blood pressure  1  . loratadine (CLARITIN) 10 MG tablet Take by mouth.    . traZODone (DESYREL) 50 MG tablet TAKE 1/2 TO 1 TABLET (25-50 MG TOTAL) BY MOUTH AT BEDTIME AS NEEDED FOR SLEEP. 90 tablet 1  . buPROPion (WELLBUTRIN XL) 300 MG 24 hr tablet Take 1 tablet (300 mg total) by mouth daily. 90 tablet 1  . levothyroxine (SYNTHROID, LEVOTHROID) 75 MCG tablet TAKE 1 TABLET BY MOUTH EVERY DAY FOR LOW THYROID  2   No current facility-administered medications for this visit.      Musculoskeletal: Strength & Muscle Tone: within normal limits Gait & Station: normal Patient leans: N/A  Psychiatric Specialty Exam: Review of Systems  Psychiatric/Behavioral: The patient is nervous/anxious.   All other systems reviewed and are negative.   Blood pressure 137/79, pulse 66, temperature (!) 97.5 F (36.4 C), temperature source Oral, weight 119 lb 9.6 oz (54.3 kg).Body mass index is 24.44 kg/m.  General Appearance: Casual  Eye Contact:  Fair  Speech:  Normal Rate   Volume:  Normal  Mood:  Anxious  Affect:  Congruent  Thought Process:  Goal Directed and Descriptions of Associations: Intact  Orientation:  Full (Time, Place, and Person)  Thought Content: Logical   Suicidal Thoughts:  No  Homicidal Thoughts:  No  Memory:  Immediate;   Fair Recent;   Fair Remote;   Fair  Judgement:  Fair  Insight:  Fair  Psychomotor Activity:  Normal  Concentration:  Concentration: Fair and Attention Span: Fair  Recall:  [employer] of Knowledge: Fair  Language: Fair  Akathisia:  No  Handed:  Right  AIMS (if indicated): NA  Assets:  Communication Skills Desire for Improvement Social Support  ADL's:  Intact  Cognition: WNL  Sleep:  Fair   Screenings:   Assessment and Plan: Janyra is a 66 year old Caucasian female who has a history of mood symptoms, hypothyroidism, hypertension, presented to the clinic today for a follow-up visit.  Patient with several psychosocial stressors including the death of her daughter from seizure disorder 3 years ago, recent engagement with her boyfriend, history of trauma growing up and so on.  Patient has been noncompliant with Lamictal and reports she wants to continue psychotherapy sessions for now.  She reports improvement in her mood symptoms since her last visit.  Plan as noted below.  Plan Bipolar disorder type II Continue Wellbutrin XL 300 mg p.o. daily.  Her dosage was reduced to 150 mg but she increased the dosage herself to 300 mg.  We will change her Wellbutrin back to 300 mg today Continue Prozac 10 mg p.o. daily. Discontinue Lamictal 25 mg due to noncompliance as well as side effects.  For GAD Prozac 10 mg p.o. Daily. Patient to continue psychotherapy sessions on a weekly basis with our therapist here in clinic.  Social anxiety disorder Continue psychotherapy.  Tobacco use disorder in remission Continue to monitor   Follow-up in clinic in 6 weeks or sooner if needed.  More than 50 % of the time was spent  for psychoeducation and supportive psychotherapy and care coordination.  This note was generated in part or whole with voice recognition software. Voice recognition is usually quite accurate but there are transcription errors that can and very often do occur. I apologize for any typographical errors that were not detected and corrected.       Jomarie Longs, MD 07/29/2018, 12:52 PM

## 2018-08-01 ENCOUNTER — Ambulatory Visit (INDEPENDENT_AMBULATORY_CARE_PROVIDER_SITE_OTHER): Payer: Medicare Other | Admitting: Licensed Clinical Social Worker

## 2018-08-01 ENCOUNTER — Encounter: Payer: Self-pay | Admitting: Licensed Clinical Social Worker

## 2018-08-01 DIAGNOSIS — F3181 Bipolar II disorder: Secondary | ICD-10-CM

## 2018-08-01 DIAGNOSIS — F411 Generalized anxiety disorder: Secondary | ICD-10-CM | POA: Diagnosis not present

## 2018-08-01 NOTE — Progress Notes (Signed)
   THERAPIST PROGRESS NOTE  Session Time:1100-1200  Participation Level: Active  Behavioral Response: CasualAlertDepressed  Type of Therapy: Individual Therapy  Treatment Goals addressed: Coping  Interventions: CBT, Supportive and Reframing  Summary: Julie Harrison is a 67 y.o. female who presents with symptoms related to her diagnosis. Julie Harrison reports feeling depressed over the last week, and stated she had a few crying spells. She reports being non-adherent with medications due to adverse side-effects. We discussed her other hesitations with taking medication, and explored feelings behind those thoughts. Julie Harrison was able to recognize the need for medication after discussing it more at length. We moved on to discuss her relationship with her mother, and how more firm boundaries are needed. We revisited the need for assertive communication in all healthy relationships, and what that would look like if implemented in her relationship with her mother. We discussed Julie Harrison's feelings of social anxiety, and how it is related to her relationship with her mother. Julie Harrison stated when she was growing up, her mother had a lot of parties that Julie Harrison was expected to be a part of. She stated, even at that age, she hated social gatherings. Julie Harrison normalized the idea of not wanting to be overwhelmed in social situations, and we discussed the difficulty in black and white thinking around this issue. Finally, we discussed Julie Harrison's partner's problems with her seeing a therapist, and why he felt that way.   Suicidal/Homicidal: No  Therapist Response: Zalma continues to work hard at [employer] made during her sessions. She puts in work inbetween sessions and comes ready to unpack impactful events. Julie Harrison needs encouragement to utilize assertive communication, but is improving the ability to recognize where assertive communication would be beneficial.  We will continue weekly sessions to address her  emotional regulation via CBT and DBT skills.   Plan: Return again in 1 weeks.  Diagnosis: Axis I: Bipolar, Depressed    Axis II: No diagnosis    Heidi Dach, Julie Harrison 08/01/2018

## 2018-08-08 ENCOUNTER — Ambulatory Visit (INDEPENDENT_AMBULATORY_CARE_PROVIDER_SITE_OTHER): Payer: Medicare Other | Admitting: Licensed Clinical Social Worker

## 2018-08-08 ENCOUNTER — Encounter: Payer: Self-pay | Admitting: Licensed Clinical Social Worker

## 2018-08-08 DIAGNOSIS — F3181 Bipolar II disorder: Secondary | ICD-10-CM | POA: Diagnosis not present

## 2018-08-08 NOTE — Progress Notes (Signed)
   THERAPIST PROGRESS NOTE  Session Time: 1000  Participation Level: Active  Behavioral Response: Well GroomedAlertAnxious  Type of Therapy: Individual Therapy  Treatment Goals addressed: Coping  Interventions: CBT  Summary: Nahal Wanless is a 67 y.o. female who presents with symptoms of depression and anxiety. Jazmarie reports having a good past week and states the depression she was feeling at our last session has lifted, and she thinks "I may be on one of my high swings, but I've also been taking both medications like I'm supposed to." We discussed that, in either case, the result has been positive. Nazli confirmed and stated she always feels better when she is able to "get more done." She discussed things in her relationship are going well, and her and her boyfriend are getting along much better than they had in the previous week. We discussed family dynamics and how they contribute to emotional regulation and difficulty managing emotions. She discussed her weekend plans and how seeing family would be beneficial. Reatha reported she "finally got rid of my ex husband's ashes." She then discussed how she felt relief after spreading his ashes, and stated it felt like a "final goodbye." She then discussed her marriage with him, and stated she felt he was a "psychopath. He was very intelligent and very into the occult. We had a lot of very scary things happen in that house, and I kind of wonder if he was trying to control me through the occult." We discussed this idea briefly, but recognized we would need to discuss this at her next session as we were out of time.   Suicidal/Homicidal: No  Therapist Response: Donnarae presented with a brighter affect than her previous session, and reports returning to baseline in terms of her emotional health. I provided her with though challenging questions to assist her in decreasing anxiety around social situations, which we can discuss at her next session.  Taheera continues to show excellent insight and is able to process appropriately in session.   Plan: Return again in 1 weeks.  Diagnosis: Axis I: Bipolar 2 Disorder, Major Depressive Episode    Axis II: No diagnosis    Heidi Dach, LCSW 08/08/2018

## 2018-08-15 ENCOUNTER — Other Ambulatory Visit: Payer: Self-pay | Admitting: Psychiatry

## 2018-08-15 DIAGNOSIS — F3181 Bipolar II disorder: Secondary | ICD-10-CM

## 2018-08-21 ENCOUNTER — Encounter: Payer: Self-pay | Admitting: Licensed Clinical Social Worker

## 2018-08-21 ENCOUNTER — Ambulatory Visit (INDEPENDENT_AMBULATORY_CARE_PROVIDER_SITE_OTHER): Payer: Medicare Other | Admitting: Licensed Clinical Social Worker

## 2018-08-21 DIAGNOSIS — F411 Generalized anxiety disorder: Secondary | ICD-10-CM

## 2018-08-21 DIAGNOSIS — F3181 Bipolar II disorder: Secondary | ICD-10-CM

## 2018-08-21 NOTE — Progress Notes (Signed)
   THERAPIST PROGRESS NOTE  Session Time: 1000  Participation Level: Active  Behavioral Response: Well GroomedAlertNA  Type of Therapy: Individual Therapy  Treatment Goals addressed: Coping  Interventions: CBT and Supportive  Summary: Julie Harrison is a 67 y.o. female who presents with symptoms related to her diagnosis. Julie Harrison reports doing well since our last session. She reports her mother's boyfriend has been having some medical issues, and at 67 years old, it has had an impact on his health in the long term as well. She reports her mother, who is 36, has been "doing too much and I'm just worrying a lot about her." We discussed the idea of setting boundaries, but also accepting the notion her mother will do what she feels she needs to do with or without the blessing from Julie Harrison. Julie Harrison was able to recognize this idea and understand she does not have control over her mother's actions. Julie Harrison reports going to visit her daughter's friend in prison, and stated being in the prison brought up a lot of memories from her deceased daughter. LCSW helped Julie Harrison reframe negative thoughts regarding her daughter's previous incarceration into more positive thoughts rooted in her daughter's ability to make meaningful friendships wherever she was--even prison.   Suicidal/Homicidal: No   Therapist Response:  We will continue using CBT to reframe thoughts to minimize anxiety and depression symptoms moving forward. Julie Harrison continues to show excellent insight at each session, and does the work inbetween sessions in order to come ready to discuss her progress at each appointment.   Plan: Return again in 1 weeks.  Diagnosis: Axis I: Bipolar 2 Disorder (Major Depressive epsidoe)    Axis II: No diagnosis    Heidi Dach, LCSW 08/21/2018

## 2018-09-04 ENCOUNTER — Encounter: Payer: Self-pay | Admitting: Licensed Clinical Social Worker

## 2018-09-04 ENCOUNTER — Ambulatory Visit (INDEPENDENT_AMBULATORY_CARE_PROVIDER_SITE_OTHER): Payer: Medicare Other | Admitting: Licensed Clinical Social Worker

## 2018-09-04 DIAGNOSIS — F3181 Bipolar II disorder: Secondary | ICD-10-CM

## 2018-09-04 NOTE — Progress Notes (Signed)
   THERAPIST PROGRESS NOTE  Session Time: 1100  Participation Level: Active  Behavioral Response: Well GroomedAlertNA  Type of Therapy: Individual Therapy  Treatment Goals addressed: Coping  Interventions: CBT  Summary: [name] is a 67 y.o. female who presents with symptoms related to her diagnosis. Julie Harrison reports doing well over the last two weeks, and was excited to report she was moving in with her partner. She stated they found a house to rent recently, and they are anticipating moving in on December 1st. Julie Harrison expressed anxiety around the move, but was able to recognize this was related to change in general. She reported being surprised at how her partner had been acting (in a positive way), and was feeling hopeful about the move. She reports continued anxiety around her mother's partner, but reports feeling the move will allow her to create physical space between her and that problem. She reported a recent interaction with family friends, during which she felt very annoyed by the way a man was talking. She was able to recognize this was related to the way her ex-husband spoke to her, and was able to explain her reaction to her partner.   Suicidal/Homicidal: No  Therapist Response: Julie Harrison reports doing well and functioning at baseline over the last two weeks. She reports being able to reframe thoughts in order to reduce anxiety and depression around life events. We will continue to utilize CBT moving forward to manage anxiety and depression symptoms.   Plan: Return again in 2 weeks.  Diagnosis: Axis I: Bipolar 2 Disorder, Major Depressive Episode    Axis II: No diagnosis    Heidi DachKelsey Shatha Hooser, LCSW 09/04/2018

## 2018-09-09 ENCOUNTER — Encounter: Payer: Self-pay | Admitting: Psychiatry

## 2018-09-09 ENCOUNTER — Ambulatory Visit (INDEPENDENT_AMBULATORY_CARE_PROVIDER_SITE_OTHER): Payer: Medicare Other | Admitting: Psychiatry

## 2018-09-09 VITALS — BP 129/73 | HR 71 | Temp 99.0°F | Wt 121.2 lb

## 2018-09-09 DIAGNOSIS — F411 Generalized anxiety disorder: Secondary | ICD-10-CM

## 2018-09-09 DIAGNOSIS — F3181 Bipolar II disorder: Secondary | ICD-10-CM | POA: Diagnosis not present

## 2018-09-09 DIAGNOSIS — F4010 Social phobia, unspecified: Secondary | ICD-10-CM

## 2018-09-09 MED ORDER — FLUOXETINE HCL 10 MG PO CAPS: 20.0000 mg | ORAL_CAPSULE | Freq: Every day | ORAL | 1 refills | Status: DC

## 2018-09-09 NOTE — Progress Notes (Signed)
BH MD  OP Progress Note  09/09/2018 6:55 PM Julie Harrison  MRN:  [id number]  Chief Complaint: ' I am here for follow up." Chief Complaint    Follow-up; Medication Refill     HPI: Julie Harrison is a 67 year old Caucasian female, engaged, retired, lives in [place], has a history of depression, anxiety, hypothyroidism, hypertension, presented to the clinic today for a follow-up visit.  Patient today reports she has noticed some improvement in her mood symptoms on the current medication regimen.  She reports she has been more compliant with her Prozac now and has noticed it as helpful.  Patient continues to be compliant with her Wellbutrin.  She reports she has noticed some flaring up of her IBS recently.  She continues to work with her provider for the same.  Patient reports she is currently going through a lot of psychosocial stressors.  She reports she is going to get married soon and is planning on moving in with her fianc.  Patient reports these are good changes however she feels overwhelmed on and off.  She wonders whether her Prozac dosage needs to be increased or not.  Discussed with patient that Prozac can have an impact on her GI system and can have adverse effects like nausea, diarrhea and so on.  Discussed with patient to increase the dosage to 20 mg and monitor herself closely.  Also discussed the possibility of hypomanic or manic episodes on the Prozac as well as the Wellbutrin combination.  Patient reports she will closely monitor herself and let writer know.  She has not yet started the Lamictal which was prescribed to her previously.  Patient reports talking to her therapist has helped her and she would like to maybe try the medication at some point.  Patient continues to be in psychotherapy sessions with Adelina Mings and reports good benefit from the same. Visit Diagnosis:    ICD-10-CM   1. Bipolar 2 disorder, major depressive episode (HCC) F31.81   2. GAD (generalized anxiety disorder)  F41.1   3. Social anxiety disorder F40.10     Past Psychiatric History: I have reviewed past psychiatric history from my progress note on 03/04/2018.  Past trials of Viibryd, duloxetine, Pristiq, Zoloft, Lexapro, Latuda, Prozac,brintellix.  Past Medical History:  Past Medical History:  Diagnosis Date  . Hypertension   . 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 Personal history of colonic polyps   . Thyroid disease   . Tobacco dependence   . Vitamin D deficiency    No past surgical history on file.  Family Psychiatric History: Have reviewed family psychiatric history from my progress note on 03/04/2018  Family History:  Family History  Problem Relation Age of Onset  . Depression Mother   . Alcohol abuse Father   . Alcohol abuse Paternal Uncle   . Depression Maternal Grandmother     Social History: Reviewed social history from my progress note on 03/04/2018 Social History   Socioeconomic History  . Marital status: Divorced    Spouse name: Not on file  . Number of children: 2  . Years of education: Not on file  . Highest education level: Associate degree: occupational, Scientist, product/process development, or vocational program  Occupational History    Comment: retired  Engineer, production  . Financial resource strain: Not hard at all  . Food insecurity:    Worry: Never true    Inability: Never true  . Transportation needs:    Medical: No  Non-medical: No  Tobacco Use  . Smoking status: Former Smoker    Packs/day: 0.25    Types: Cigarettes    Last attempt to quit: 03/22/2018    Years since quitting: 0.4  . Smokeless tobacco: Never Used  Substance and Sexual Activity  . Alcohol use: Yes    Alcohol/week: 2.0 standard drinks    Types: 2 Glasses of wine per week  . Drug use: Yes    Types: Marijuana    Comment: about a month ago  . Sexual activity: Yes  Lifestyle  . Physical activity:    Days per week: 0 days    Minutes per session: 0 min  .  Stress: Not at all  Relationships  . Social connections:    Talks on phone: More than three times a week    Gets together: Once a week    Attends religious service: More than 4 times per year    Active member of club or organization: No    Attends meetings of clubs or organizations: Never    Relationship status: Divorced  Other Topics Concern  . Not on file  Social History Narrative  . Not on file    Allergies:  Allergies  Allergen Reactions  . Hydralazine Hcl Itching  . Erythromycin Nausea Only  . Hydrochlorothiazide Itching  . Vortioxetine Nausea Only    Metabolic Disorder Labs: No results found for: HGBA1C, MPG No results found for: PROLACTIN No results found for: CHOL, TRIG, HDL, CHOLHDL, VLDL, LDLCALC No results found for: TSH  Therapeutic Level Labs: No results found for: LITHIUM No results found for: VALPROATE No components found for:  CBMZ  Current Medications: Current Outpatient Medications  Medication Sig Dispense Refill  . alendronate (FOSAMAX) 70 MG tablet Take by mouth.    Marland Kitchen amoxicillin (AMOXIL) 500 MG capsule TAKE 2 CAPSULES BY MOUTH STAT, THEN 1 CAPSULE 3 TIMES DAILY UNTIL GONE  1  . buPROPion (WELLBUTRIN XL) 300 MG 24 hr tablet Take 1 tablet (300 mg total) by mouth daily. 90 tablet 1  . Calcium Carbonate-Vitamin D (OYSTER SHELL CALCIUM 500 + D) 500-125 MG-UNIT TABS Take by mouth.    Marland Kitchen FLUoxetine (PROZAC) 10 MG capsule Take 2 capsules (20 mg total) by mouth daily with breakfast. 60 capsule 1  . levothyroxine (SYNTHROID, LEVOTHROID) 75 MCG tablet TAKE 1 TABLET BY MOUTH EVERY DAY FOR LOW THYROID  2  . lisinopril (PRINIVIL,ZESTRIL) 5 MG tablet Take 5 mg by mouth daily. for high blood pressure  1  . loratadine (CLARITIN) 10 MG tablet Take by mouth.    . traZODone (DESYREL) 50 MG tablet TAKE 1/2 TO 1 TABLET (25-50 MG TOTAL) BY MOUTH AT BEDTIME AS NEEDED FOR SLEEP. 90 tablet 1   No current facility-administered medications for this visit.       Musculoskeletal: Strength & Muscle Tone: within normal limits Gait & Station: normal Patient leans: N/A  Psychiatric Specialty Exam: Review of Systems  Psychiatric/Behavioral: The patient is nervous/anxious.   All other systems reviewed and are negative.   Blood pressure 129/73, pulse 71, temperature 99 F (37.2 C), temperature source Oral, weight 121 lb 3.2 oz (55 kg).Body mass index is 24.76 kg/m.  General Appearance: Casual  Eye Contact:  Fair  Speech:  Clear and Coherent  Volume:  Normal  Mood:  Anxious  Affect:  Congruent  Thought Process:  Goal Directed and Descriptions of Associations: Intact  Orientation:  Full (Time, Place, and Person)  Thought Content: Logical   Suicidal  Thoughts:  No  Homicidal Thoughts:  No  Memory:  Immediate;   Fair Recent;   Fair Remote;   Fair  Judgement:  Fair  Insight:  Fair  Psychomotor Activity:  Normal  Concentration:  Concentration: Fair and Attention Span: Fair  Recall:  [employer]  Fund of Knowledge: Fair  Language: Fair  Akathisia:  No  Handed:  Right  AIMS (if indicated): denies tremors, rigidity,stiffness  Assets:  Communication Skills Desire for Improvement Social Support  ADL's:  Intact  Cognition: WNL  Sleep:  Fair   Screenings:   Assessment and Plan: Julie Harrison is a 67 year old Caucasian female who has a history of mood symptoms, hypothyroidism, hypertension, presented to the clinic today for a follow-up visit.  Patient with several psychosocial stressors including the death of her daughter from seizure disorder 3 years ago, recent engagement with her boyfriend, planning to move in with her boyfriend soon, history of trauma growing up and so on.  Patient reports being more compliant with her medications and would like a dosage readjustment since she continues to struggle with some anxiety symptoms.  We will continue medication management as noted below.  Plan Bipolar disorder type II Continue Wellbutrin XL 300 mg p.o.  daily Increase Prozac to 20 mg p.o. Daily.  GAD Prozac 20 mg p.o. daily. Continue psychotherapy with Ms. Heidi DachKelsey Craig.  Social anxiety disorder Continue psychotherapy.  Tobacco use disorder in remission We will continue to monitor closely.  Follow-up in clinic in 1 month or sooner if needed.  More than 50 % of the time was spent for psychoeducation and supportive psychotherapy and care coordination.  This note was generated in part or whole with voice recognition software. Voice recognition is usually quite accurate but there are transcription errors that can and very often do occur. I apologize for any typographical errors that were not detected and corrected.       Jomarie LongsSaramma Bryson Gavia, MD 09/09/2018, 6:55 PM

## 2018-09-15 ENCOUNTER — Other Ambulatory Visit: Payer: Self-pay | Admitting: Psychiatry

## 2018-09-18 ENCOUNTER — Ambulatory Visit: Payer: Medicare Other | Admitting: Licensed Clinical Social Worker

## 2018-09-30 ENCOUNTER — Ambulatory Visit (INDEPENDENT_AMBULATORY_CARE_PROVIDER_SITE_OTHER): Payer: Medicare Other | Admitting: Licensed Clinical Social Worker

## 2018-09-30 ENCOUNTER — Encounter: Payer: Self-pay | Admitting: Licensed Clinical Social Worker

## 2018-09-30 DIAGNOSIS — F3181 Bipolar II disorder: Secondary | ICD-10-CM | POA: Diagnosis not present

## 2018-09-30 NOTE — Progress Notes (Signed)
   THERAPIST PROGRESS NOTE  Session Time: 0900  Participation Level: Active  Behavioral Response: Well GroomedAlertAnxious  Type of Therapy: Individual Therapy  Treatment Goals addressed: Anxiety  Interventions: CBT  Summary: Julie Harrison is a 67 y.o. female who presents with symptoms related to her diagnosis. Julie Harrison reports things have been going well since her last session, and stated she recently moved into a new home with her partner. She reports the move has been stressful, but overall has been a positive change. She states she has had to set a few boundaries with her partner, but has felt comfortable verbalizing those thoughts. For example, her partner had a picture of pt's daughter (from when he knew her) in his bedroom and moved it into the living room. Julie Harrison did not express that this made her feel uncomfortable due to her feeling uncomfortable about his relationship with her daughter, but rather expressed that she did not want it in the living room. Pt's partner then moved it back into his bedroom. LCSW encouraged Julie Harrison to express her feelings to her partner about the nature of her feelings around the photo. Julie Harrison expressed understanding and agreement with this idea. She also reported missing her mother since moving out, but stated it's "a good miss," and she feels less responsible for her mother since she has moved out.    Suicidal/Homicidal: No  Therapist Response: Julie Harrison shows great insight and is able toe express herself appropriately during sessions. She utilizes skills learned in previous sessions and is able to apply them to her daily life. We will continue to utilize CBT moving forward to assist Julie Harrison in managing her anxiety and depression symptoms.   Plan: Return again in 1 weeks.  Diagnosis: Axis I: Bipolar 2 Disorder    Axis II: No diagnosis    Julie DachKelsey Eulia Hatcher, LCSW 09/30/2018

## 2018-10-08 ENCOUNTER — Other Ambulatory Visit: Payer: Self-pay | Admitting: Psychiatry

## 2018-10-10 ENCOUNTER — Ambulatory Visit: Payer: Medicare Other | Admitting: Psychiatry

## 2018-10-21 ENCOUNTER — Ambulatory Visit (INDEPENDENT_AMBULATORY_CARE_PROVIDER_SITE_OTHER): Payer: Medicare Other | Admitting: Licensed Clinical Social Worker

## 2018-10-21 ENCOUNTER — Encounter: Payer: Self-pay | Admitting: Licensed Clinical Social Worker

## 2018-10-21 DIAGNOSIS — F3181 Bipolar II disorder: Secondary | ICD-10-CM

## 2018-10-21 NOTE — Progress Notes (Signed)
   THERAPIST PROGRESS NOTE  Session Time: 1000  Participation Level: Active  Behavioral Response: Well GroomedAlertAnxious  Type of Therapy: Individual Therapy  Treatment Goals addressed: Anxiety  Interventions: CBT  Summary: Julie Harrison is a 68 y.o. female who presents with symptoms related to her diagnosis. Julie Harrison reports things have been going well for her emotionally, but noted she has found herself becoming more irritated with her partner since moving in with him. She reported several instances of him demonstrating poor boundaries with her space, and a lack of understanding where sex is concerned. Julie Harrison encouraged Julie Harrison to set boundaries by utilizing assertive communication at a time when she is not upset. Julie Harrison questioned why she wasn't able to do this better in the moment. Julie Harrison highlighted Julie Harrison's previous relationships where if she noted her feelings or concerns, it was met with emotional abuse. Tonea was conditioned to make herself and feelings smaller in order to avoid a larger conflict. Julie Harrison was able to recognize this concept and agreed that she was carrying some of that into her new relationship with her partner. Julie Harrison normalized those feelings, and assist Julie Harrison in identifying ways she could communicate in an assertive, yet appropriate, way to avoid escalation. Julie Harrison expressed understanding and agreement with this idea.   Suicidal/Homicidal: No  Therapist Response: Julie Harrison is able to speak openly and honestly about her feelings, and is able to recognize concepts presented by Julie Harrison. Julie Harrison is able to use skills learned in previous sessions and is able to understand new skills as well. We will continue to utilize CBT moving forward to assist Julie Harrison in managing her emotional regulation. Additionally, we will continue to work on assertive communication and confidence building.   Plan: Return again in 2 weeks.  Diagnosis: Axis I: Bipolar 2 Disorder, major depressive  episode    Axis II: No diagnosis    Alden Hipp, Julie Harrison 10/21/2018

## 2018-11-04 ENCOUNTER — Encounter: Payer: Self-pay | Admitting: Psychiatry

## 2018-11-04 ENCOUNTER — Other Ambulatory Visit: Payer: Self-pay

## 2018-11-04 ENCOUNTER — Ambulatory Visit (INDEPENDENT_AMBULATORY_CARE_PROVIDER_SITE_OTHER): Payer: Medicare Other | Admitting: Psychiatry

## 2018-11-04 VITALS — BP 121/70 | HR 81 | Temp 97.7°F | Wt 121.4 lb

## 2018-11-04 DIAGNOSIS — F4010 Social phobia, unspecified: Secondary | ICD-10-CM

## 2018-11-04 DIAGNOSIS — F17201 Nicotine dependence, unspecified, in remission: Secondary | ICD-10-CM

## 2018-11-04 DIAGNOSIS — F3181 Bipolar II disorder: Secondary | ICD-10-CM | POA: Diagnosis not present

## 2018-11-04 DIAGNOSIS — F411 Generalized anxiety disorder: Secondary | ICD-10-CM

## 2018-11-04 MED ORDER — FLUOXETINE HCL 10 MG PO CAPS: 10.0000 mg | ORAL_CAPSULE | Freq: Every day | ORAL | 1 refills | Status: DC

## 2018-11-04 NOTE — Progress Notes (Signed)
BH MD OP Progress Note  11/04/2018 5:52 PM Julie Harrison  MRN:  [id number]  Chief Complaint: ' I am here for follow up.' Chief Complaint    Follow-up     HPI:   Julie Harrison is a 68 year old Caucasian female, engaged, retired, lives in [place], has a history of depression, anxiety, hypothyroidism, hypertension, presented to the clinic today for a follow-up visit.   Patient today reports she enjoys the new house that she moved into.  She currently is planning a wedding with her boyfriend and is hopefully going to have it in April.  She reports she looks forward to that.  She reports January is the birthday and  the death anniversary of her daughter which kind of make her distressed on and off.  She however reports her boyfriend is a major support system.  She also has been in therapy with Ms. Julie Harrison which is going well.  Patient reports her medications as helpful.  She reports she is taking the Prozac at a lower dosage of 10 mg since the higher dosage made her restless and shaky.  She reports the current dosage is helpful.  Patient denies any suicidality, homicidality or perceptual disturbances.  She denies any other concerns right now.   Visit Diagnosis:    ICD-10-CM   1. Bipolar 2 disorder, major depressive episode (HCC) F31.81   2. GAD (generalized anxiety disorder) F41.1   3. Social anxiety disorder F40.10   4. 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  Past trials of Viibryd, duloxetine, Pristiq, Zoloft, Lexapro, Latuda, Prozac, Brintellix.  Past Medical History:  Past Medical History:  Diagnosis Date  . Hypertension   . 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 Personal history of colonic polyps   . Thyroid disease   . Tobacco dependence   . Vitamin D deficiency    History reviewed. No pertinent  surgical history.  Family Psychiatric History: Reviewed family psychiatric history from my progress note on 03/04/2018.  Family History:  Family History  Problem Relation Age of Onset  . Depression Mother   . Alcohol abuse Father   . Alcohol abuse Paternal Uncle   . Depression Maternal Grandmother     Social History: Reviewed social history from my progress note on 03/04/2018. Social History   Socioeconomic History  . Marital status: Divorced    Spouse name: Not on file  . Number of children: 2  . Years of education: Not on file  . Highest education level: Associate degree: occupational, Scientist, product/process development, or vocational program  Occupational History    Comment: retired  Engineer, production  . Financial resource strain: Not hard at all  . Food insecurity:    Worry: Never true    Inability: Never true  . Transportation needs:    Medical: No    Non-medical: No  Tobacco Use  . Smoking status: Former Smoker    Packs/day: 0.25    Types: Cigarettes    Last attempt to quit: 03/22/2018    Years since quitting: 0.6  . Smokeless tobacco: Never Used  Substance and Sexual Activity  . Alcohol use: Yes    Alcohol/week: 2.0 standard drinks    Types: 2 Glasses of wine per week  . Drug use: Yes    Types: Marijuana    Comment: about a month ago  . Sexual activity:  Yes  Lifestyle  . Physical activity:    Days per week: 0 days    Minutes per session: 0 min  . Stress: Not at all  Relationships  . Social connections:    Talks on phone: More than three times a week    Gets together: Once a week    Attends religious service: More than 4 times per year    Active member of club or organization: No    Attends meetings of clubs or organizations: Never    Relationship status: Divorced  Other Topics Concern  . Not on file  Social History Narrative  . Not on file    Allergies:  Allergies  Allergen Reactions  . Hydralazine Hcl Itching  . Erythromycin Nausea Only  . Hydrochlorothiazide Itching  .  Vortioxetine Nausea Only    Metabolic Disorder Labs: No results found for: HGBA1C, MPG No results found for: PROLACTIN No results found for: CHOL, TRIG, HDL, CHOLHDL, VLDL, LDLCALC No results found for: TSH  Therapeutic Level Labs: No results found for: LITHIUM No results found for: VALPROATE No components found for:  CBMZ  Current Medications: Current Outpatient Medications  Medication Sig Dispense Refill  . alendronate (FOSAMAX) 70 MG tablet Take by mouth.    Marland Kitchen. amoxicillin (AMOXIL) 500 MG capsule TAKE 2 CAPSULES BY MOUTH STAT, THEN 1 CAPSULE 3 TIMES DAILY UNTIL GONE  1  . buPROPion (WELLBUTRIN XL) 300 MG 24 hr tablet Take 1 tablet (300 mg total) by mouth daily. 90 tablet 1  . Calcium Carbonate-Vitamin D (OYSTER SHELL CALCIUM 500 + D) 500-125 MG-UNIT TABS Take by mouth.    Marland Kitchen. FLUoxetine (PROZAC) 10 MG capsule Take 1 capsule (10 mg total) by mouth daily with breakfast. 90 capsule 1  . levothyroxine (SYNTHROID, LEVOTHROID) 75 MCG tablet TAKE 1 TABLET BY MOUTH EVERY DAY FOR LOW THYROID  2  . lisinopril (PRINIVIL,ZESTRIL) 5 MG tablet Take 5 mg by mouth daily. for high blood pressure  1  . loratadine (CLARITIN) 10 MG tablet Take by mouth.    . traZODone (DESYREL) 50 MG tablet TAKE 1/2 TO 1 TABLET (25-50 MG TOTAL) BY MOUTH AT BEDTIME AS NEEDED FOR SLEEP. 90 tablet 1   No current facility-administered medications for this visit.      Musculoskeletal: Strength & Muscle Tone: within normal limits Gait & Station: normal Patient leans: N/A  Psychiatric Specialty Exam: Review of Systems  Psychiatric/Behavioral: Negative for depression. The patient is not nervous/anxious.   All other systems reviewed and are negative.   Blood pressure 121/70, pulse 81, temperature 97.7 F (36.5 C), temperature source Oral, weight 121 lb 6.4 oz (55.1 kg).Body mass index is 24.8 kg/m.  General Appearance: Casual  Eye Contact:  Fair  Speech:  Clear and Coherent  Volume:  Normal  Mood:  euthymic   Affect:  Congruent  Thought Process:  Goal Directed and Descriptions of Associations: Intact  Orientation:  Full (Time, Place, and Person)  Thought Content: Logical   Suicidal Thoughts:  No  Homicidal Thoughts:  No  Memory:  Immediate;   Fair Recent;   Fair Remote;   Fair  Judgement:  Fair  Insight:  Fair  Psychomotor Activity:  Normal  Concentration:  Concentration: Fair and Attention Span: Fair  Recall:  [employer]Fair  Fund of Knowledge: Fair  Language: Fair  Akathisia:  No  Handed:  Right  AIMS (if indicated):denies tremors, rigidity,stiffness  Assets:  Communication Skills Desire for Improvement Social Support  ADL's:  Intact  Cognition:  WNL  Sleep:  Fair   Screenings:   Assessment and Plan: Melinna is a 68 year old Caucasian female who has a history of mood symptoms, hypothyroidism, hypertension, presented to the clinic today for a follow-up visit.  Patient with several psychosocial stressors including the death of her daughter from seizure disorder, recent engagement with her boyfriend, history of trauma growing up.  Patient reports this week being the birthday and that the anniversary of her daughter is stressful however she continues to have good social support system especially from her boyfriend with whom she stays.  We will continue medications as noted below.  Plan Bipolar disorder type II- improving Continue Wellbutrin extended release 300 mg p.o. daily Continue Prozac at a reduced dosage of 10 mg p.o. daily.  For GAD-improving Prozac 10 mg p.o. daily Continue psychotherapy with Ms. Julie Harrison.  Social anxiety disorder-improving Continue CBT.  Tobacco use disorder in remission We will continue to monitor closely.  Follow-up in clinic in 1 to 2 months or sooner if needed.  I have spent atleast 15 minutes face to face with patient today. More than 50 % of the time was spent for psychoeducation and supportive psychotherapy and care coordination.  This note was  generated in part or whole with voice recognition software. Voice recognition is usually quite accurate but there are transcription errors that can and very often do occur. I apologize for any typographical errors that were not detected and corrected.           Jomarie Longs, MD 11/04/2018, 5:52 PM

## 2018-11-07 ENCOUNTER — Other Ambulatory Visit: Payer: Self-pay | Admitting: Psychiatry

## 2018-11-18 ENCOUNTER — Ambulatory Visit: Payer: Medicare Other | Admitting: Licensed Clinical Social Worker

## 2018-12-02 ENCOUNTER — Encounter: Payer: Self-pay | Admitting: Licensed Clinical Social Worker

## 2018-12-02 ENCOUNTER — Ambulatory Visit (INDEPENDENT_AMBULATORY_CARE_PROVIDER_SITE_OTHER): Payer: Medicare Other | Admitting: Licensed Clinical Social Worker

## 2018-12-02 DIAGNOSIS — F3181 Bipolar II disorder: Secondary | ICD-10-CM

## 2018-12-02 NOTE — Progress Notes (Signed)
   THERAPIST PROGRESS NOTE  Session Time: 1430-1530  Participation Level: Active  Behavioral Response: Well GroomedAlertAnxious  Type of Therapy: Individual Therapy  Treatment Goals addressed: Anxiety  Interventions: Supportive  Summary: Julie Harrison is a 68 y.o. female who presents with continued symptoms of her diagnosis. Julie Harrison reports "a lot has happened since last time I saw you." She stated she and her partner have decided to get married, which has brought on a lot of happiness along with a lot of stress. She reports feeling she is doing a lot of the wedding plans around what others are expecting versus what she actually wants. We discussed ways to determine what she wants and what others are influcing her about. Julie Harrison expressed understanding and agreement. Julie Harrison reported feeling she needs to communicate better with her partner when she is upset. We discussed ways to improve assertive communication, and how to do so within the dynamics of a romantic relationship. Julie Harrison expressed understanding of these ideas as well. Julie Harrison reported having complicated feelings around not having her daughter at her wedding, and what that has brought up for her emotionally. We discussed ways she could incorporate her daughter into the ceremony that would make Julie Harrison feel comfortable.    Suicidal/Homicidal: No  Therapist Response: Julie Harrison continues to work towards her tx goals but has not yet reached them. We will continue to utilize CBT moving forward to assist her in managing her anxiety and depression.   Plan: Return again in 2 weeks.  Diagnosis: Axis I: Bipolar 2 Disorder, Major depressive episode    Axis II: No diagnosis    Heidi Dach, LCSW 12/02/2018

## 2018-12-16 ENCOUNTER — Encounter: Payer: Self-pay | Admitting: Licensed Clinical Social Worker

## 2018-12-16 ENCOUNTER — Ambulatory Visit (INDEPENDENT_AMBULATORY_CARE_PROVIDER_SITE_OTHER): Payer: Medicare Other | Admitting: Licensed Clinical Social Worker

## 2018-12-16 DIAGNOSIS — F3181 Bipolar II disorder: Secondary | ICD-10-CM

## 2018-12-16 NOTE — Progress Notes (Signed)
   THERAPIST PROGRESS NOTE  Session Time: 1100-1155  Participation Level: Active  Behavioral Response: NeatAlertAnxious  Type of Therapy: Individual Therapy  Treatment Goals addressed: Anxiety  Interventions: CBT  Summary: Julie Harrison is a 68 y.o. female who presents with continued symptoms of her diagnosis. Julie Harrison reports things have been going well since our last session. She reports continued stress around planning her wedding, but reports over all she has been able to manage her anxiety in the moment. She reports she has been able to delegate tasks to others in order to decrease her stress around wedding planning. She was able to recognize how helpful others have been, and how that has contributed to her ability to regulate her emotions effectively. Julie Harrison went on to discuss continued stress in her relationship as well. She reports feeling pressured to have sex with her fiance frequently, and states she often does not want to engage in sex at the moment. She reports she told him she did not want to have sex one night last week, "and he acted like an asshole about it and then apologized the next day." We discussed how utilizing assertive communication could assist with this problem, and Julie Harrison validated Julie Harrison's ability to assert herself in that moment. Julie Harrison reported other "small," things in their relationship that bother her, but states she feels she has thrown her focus into planning their wedding in order to not dwell on those problems. Julie Harrison validated Julie Harrison's use of distraction, and encouraged her to recognize those problems will still be there after the wedding--if they are things she can live with, she is fine to address them then. Julie Harrison also asked Julie Harrison to recognize that marriage does not have to be forever if she finds herself unhappy later on. Julie Harrison expressed agreement with that idea, and stated sometimes it is difficult to remember.   Suicidal/Homicidal: No  Therapist  Response: Julie Harrison continues to work towards her tx goals but has not yet reached them. She is able to utilize skills discussed in sessions to manage her emotions in daily life. We will continue to utilize CBT moving forward to manage anxiety and depression symptoms.   Plan: Return again in 2 weeks.  Diagnosis: Axis I: Bipolar 2 Disorder, major depressive episode    Axis II: No diagnosis    Julie Harrison, Julie Harrison 12/16/2018

## 2019-01-08 ENCOUNTER — Ambulatory Visit: Payer: Medicare Other | Admitting: Psychiatry

## 2019-01-08 ENCOUNTER — Ambulatory Visit: Payer: Medicare Other | Admitting: Licensed Clinical Social Worker

## 2019-01-29 ENCOUNTER — Other Ambulatory Visit: Payer: Self-pay | Admitting: Psychiatry

## 2019-02-03 ENCOUNTER — Other Ambulatory Visit: Payer: Self-pay

## 2019-02-03 ENCOUNTER — Ambulatory Visit (INDEPENDENT_AMBULATORY_CARE_PROVIDER_SITE_OTHER): Payer: Medicare Other | Admitting: Psychiatry

## 2019-02-03 ENCOUNTER — Encounter: Payer: Self-pay | Admitting: Psychiatry

## 2019-02-03 DIAGNOSIS — F17201 Nicotine dependence, unspecified, in remission: Secondary | ICD-10-CM

## 2019-02-03 DIAGNOSIS — F411 Generalized anxiety disorder: Secondary | ICD-10-CM

## 2019-02-03 DIAGNOSIS — F4010 Social phobia, unspecified: Secondary | ICD-10-CM | POA: Diagnosis not present

## 2019-02-03 DIAGNOSIS — F3181 Bipolar II disorder: Secondary | ICD-10-CM | POA: Diagnosis not present

## 2019-02-03 MED ORDER — BUPROPION HCL ER (XL) 300 MG PO TB24: 300.0000 mg | ORAL_TABLET | Freq: Every day | ORAL | 1 refills | Status: DC

## 2019-02-03 NOTE — Progress Notes (Signed)
Virtual Visit via Telephone Note  I connected with Julie Harrison on 02/03/19 at  9:20 AM EDT by telephone and verified that I am speaking with the correct person using two identifiers.   I discussed the limitations, risks, security and privacy concerns of performing an evaluation and management service by telephone and the availability of in person appointments. I also discussed with the patient that there may be a patient responsible charge related to this service. The patient expressed understanding and agreed to proceed.    I discussed the assessment and treatment plan with the patient. The patient was provided an opportunity to ask questions and all were answered. The patient agreed with the plan and demonstrated an understanding of the instructions.   The patient was advised to call back or seek an in-person evaluation if the symptoms worsen or if the condition fails to improve as anticipated.   BH MD OP Progress Note  02/03/2019 2:39 PM Julie Harrison  MRN:  [id number]  Chief Complaint:  Chief Complaint    Follow-up     HPI: Julie Harrison is a 68 year old Caucasian female, engaged, retired, lives currently in [place], has a history of bipolar disorder, generalized anxiety disorder, social anxiety disorder, hypothyroidism, hypertension was evaluated by phone today.  Patient today reports she currently lives with her fianc.  She reports they were planning to get married however that had to be rescheduled due to the COVID-19.  She reports she is okay with it since she is doing it for the safety of everyone.  She has been staying indoor mostly.  She and her fianc are trying to support each other.  She reports she does have some ups and downs however she has been able to cope with it better.  She continues to be compliant with her Wellbutrin and Prozac.  She denies any side effects to these medications.  Patient reports sleep is good.  She reports she continues to stay away  from smoking cigarettes and her quit date was sometime last year.  Patient denies any suicidality, homicidality or perceptual disturbances.  Patient denies any other concerns today.  She reports she will reach out to [facility] to continue psychotherapy sessions. Visit Diagnosis:    ICD-10-CM   1. Bipolar 2 disorder, major depressive episode (HCC) F31.81 buPROPion (WELLBUTRIN XL) 300 MG 24 hr tablet  2. GAD (generalized anxiety disorder) F41.1   3. Social anxiety disorder F40.10   4. Moderate tobacco use disorder, in sustained remission F17.201     Past Psychiatric History: Reviewed past psychiatric history from my progress note on 03/04/2018.  Past trials of Viibryd, duloxetine, Pristiq, Zoloft, Lexapro, Latuda, Prozac, Brintellix.  Past Medical History:  Past Medical History:  Diagnosis Date  . Hypertension   . 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 Personal history of colonic polyps   . Thyroid disease   . Tobacco dependence   . Vitamin D deficiency    History reviewed. No pertinent surgical history.  Family Psychiatric History: Reviewed family psychiatric history from my progress note on 03/04/2018.  Family History:  Family History  Problem Relation Age of Onset  . Depression Mother   . Alcohol abuse Father   . Alcohol abuse Paternal Uncle   . Depression Maternal Grandmother     Social History: Reviewed social history from my progress note on 03/04/2018. Social History   Socioeconomic History  . Marital status: Divorced    Spouse name: Not on file  .  Number of children: 2  . Years of education: Not on file  . Highest education level: Associate degree: occupational, Scientist, product/process developmenttechnical, or vocational program  Occupational History    Comment: retired  Engineer, productionocial Needs  . Financial resource strain: Not hard at all  . Food insecurity:    Worry: Never true    Inability: Never true  . Transportation needs:    Medical: No     Non-medical: No  Tobacco Use  . Smoking status: Former Smoker    Packs/day: 0.25    Types: Cigarettes    Last attempt to quit: 03/22/2018    Years since quitting: 0.8  . Smokeless tobacco: Never Used  Substance and Sexual Activity  . Alcohol use: Yes    Alcohol/week: 2.0 standard drinks    Types: 2 Glasses of wine per week  . Drug use: Yes    Types: Marijuana    Comment: about a month ago  . Sexual activity: Yes  Lifestyle  . Physical activity:    Days per week: 0 days    Minutes per session: 0 min  . Stress: Not at all  Relationships  . Social connections:    Talks on phone: More than three times a week    Gets together: Once a week    Attends religious service: More than 4 times per year    Active member of club or organization: No    Attends meetings of clubs or organizations: Never    Relationship status: Divorced  Other Topics Concern  . Not on file  Social History Narrative  . Not on file    Allergies:  Allergies  Allergen Reactions  . Hydralazine Hcl Itching  . Erythromycin Nausea Only  . Hydrochlorothiazide Itching  . Vortioxetine Nausea Only    Metabolic Disorder Labs: No results found for: HGBA1C, MPG No results found for: PROLACTIN No results found for: CHOL, TRIG, HDL, CHOLHDL, VLDL, LDLCALC No results found for: TSH  Therapeutic Level Labs: No results found for: LITHIUM No results found for: VALPROATE No components found for:  CBMZ  Current Medications: Current Outpatient Medications  Medication Sig Dispense Refill  . alendronate (FOSAMAX) 70 MG tablet Take by mouth.    Marland Kitchen. buPROPion (WELLBUTRIN XL) 300 MG 24 hr tablet Take 1 tablet (300 mg total) by mouth daily. 90 tablet 1  . Calcium Carbonate-Vitamin D (OYSTER SHELL CALCIUM 500 + D) 500-125 MG-UNIT TABS Take by mouth.    Marland Kitchen. FLUoxetine (PROZAC) 10 MG capsule TAKE 1 CAPSULE (10 MG TOTAL) BY MOUTH DAILY WITH BREAKFAST. 90 capsule 1  . levothyroxine (SYNTHROID, LEVOTHROID) 75 MCG tablet TAKE 1  TABLET BY MOUTH EVERY DAY FOR LOW THYROID  2  . lisinopril (PRINIVIL,ZESTRIL) 5 MG tablet Take 5 mg by mouth daily. for high blood pressure  1  . loratadine (CLARITIN) 10 MG tablet Take by mouth.    . traZODone (DESYREL) 50 MG tablet TAKE 1/2 TO 1 TABLET (25-50 MG TOTAL) BY MOUTH AT BEDTIME AS NEEDED FOR SLEEP. 90 tablet 1   No current facility-administered medications for this visit.      Musculoskeletal: Strength & Muscle Tone: UTA Gait & Station: UTA Patient leans: N/A  Psychiatric Specialty Exam: Review of Systems  Psychiatric/Behavioral: The patient is nervous/anxious.   All other systems reviewed and are negative.   There were no vitals taken for this visit.There is no height or weight on file to calculate BMI.  General Appearance: UTA  Eye Contact:  UTA  Speech:  Normal  Rate  Volume:  Normal  Mood:  Anxious improving  Affect:  UTA  Thought Process:  Goal Directed and Descriptions of Associations: Intact  Orientation:  Full (Time, Place, and Person)  Thought Content: Logical   Suicidal Thoughts:  No  Homicidal Thoughts:  No  Memory:  Immediate;   Fair Recent;   Fair Remote;   Fair  Judgement:  Fair  Insight:  Fair  Psychomotor Activity:  UTA  Concentration:  Concentration: Fair and Attention Span: Fair  Recall:  [employer] of Knowledge: Fair  Language: Fair  Akathisia:  No  Handed:  Right  AIMS (if indicated): Denies tremors, rigidity,stiffness  Assets:  Communication Skills Desire for Improvement Social Support  ADL's:  Intact  Cognition: WNL  Sleep:  Fair   Screenings:   Assessment and Plan: Julie Harrison is a 68 year old Caucasian female who has a history of bipolar disorder, anxiety disorder, hypothyroidism, hypertension, was evaluated by phone today.  Patient with several psychosocial stressors including the death of her daughter from seizure disorder, current COVID-19 outbreak however has been coping okay.  Patient will continue to benefit from medications  as well as psychotherapy sessions.  Plan as noted below.  Plan Bipolar disorder type II-stable Wellbutrin extended release 300 mg p.o. daily Prozac at a reduced dosage of 10 mg p.o. daily.  GAD-improving Prozac 10 mg p.o. daily. Continue psychotherapy with Ms. Tasia Catchings.  For social anxiety disorder-improving Continue CBT.  For tobacco use disorder in remission-quit date was June 2019. She continues to stay away.  Follow-up in clinic in 2 months or sooner if needed.  I have spent atleast 15 minutes non face to face with patient today. More than 50 % of the time was spent for psychoeducation and supportive psychotherapy and care coordination.  This note was generated in part or whole with voice recognition software. Voice recognition is usually quite accurate but there are transcription errors that can and very often do occur. I apologize for any typographical errors that were not detected and corrected.          Jomarie Longs, MD 02/03/2019, 2:39 PM

## 2019-02-03 NOTE — Progress Notes (Signed)
TC on  02-03-19 @ 9:12 spoke with patient reviewed patient allergies with no changes. Reviewed the medical and surgical hx with no changes.  Pt medications and pharmacy were reviewed and updated. No vitals taken because this is a phone consult.

## 2019-02-19 ENCOUNTER — Other Ambulatory Visit: Payer: Self-pay | Admitting: Psychiatry

## 2019-02-19 DIAGNOSIS — F3181 Bipolar II disorder: Secondary | ICD-10-CM

## 2019-02-23 ENCOUNTER — Other Ambulatory Visit: Payer: Self-pay | Admitting: Psychiatry

## 2019-02-23 DIAGNOSIS — F3181 Bipolar II disorder: Secondary | ICD-10-CM

## 2019-02-25 DIAGNOSIS — I2510 Atherosclerotic heart disease of native coronary artery without angina pectoris: Secondary | ICD-10-CM | POA: Insufficient documentation

## 2019-02-25 DIAGNOSIS — R0602 Shortness of breath: Secondary | ICD-10-CM | POA: Insufficient documentation

## 2019-02-25 DIAGNOSIS — I10 Essential (primary) hypertension: Secondary | ICD-10-CM | POA: Insufficient documentation

## 2019-03-19 DIAGNOSIS — E782 Mixed hyperlipidemia: Secondary | ICD-10-CM | POA: Insufficient documentation

## 2019-03-20 ENCOUNTER — Other Ambulatory Visit: Payer: Self-pay | Admitting: Psychiatry

## 2019-03-20 DIAGNOSIS — F3181 Bipolar II disorder: Secondary | ICD-10-CM

## 2019-03-31 ENCOUNTER — Encounter: Payer: Self-pay | Admitting: Psychiatry

## 2019-03-31 ENCOUNTER — Other Ambulatory Visit: Payer: Self-pay

## 2019-03-31 ENCOUNTER — Ambulatory Visit (INDEPENDENT_AMBULATORY_CARE_PROVIDER_SITE_OTHER): Payer: Medicare Other | Admitting: Psychiatry

## 2019-03-31 DIAGNOSIS — F411 Generalized anxiety disorder: Secondary | ICD-10-CM | POA: Diagnosis not present

## 2019-03-31 DIAGNOSIS — F3181 Bipolar II disorder: Secondary | ICD-10-CM | POA: Diagnosis not present

## 2019-03-31 DIAGNOSIS — F4010 Social phobia, unspecified: Secondary | ICD-10-CM

## 2019-03-31 NOTE — Progress Notes (Signed)
Virtual Visit via Telephone Note  I connected with Julie Harrison on 03/31/19 at 11:45 AM EDT by telephone and verified that I am speaking with the correct person using two identifiers.   I discussed the limitations, risks, security and privacy concerns of performing an evaluation and management service by telephone and the availability of in person appointments. I also discussed with the patient that there may be a patient responsible charge related to this service. The patient expressed understanding and agreed to proceed.    I discussed the assessment and treatment plan with the patient. The patient was provided an opportunity to ask questions and all were answered. The patient agreed with the plan and demonstrated an understanding of the instructions.   The patient was advised to call back or seek an in-person evaluation if the symptoms worsen or if the condition fails to improve as anticipated.  Julie Alert, MD  [facility] MD OP Progress Note  03/31/2019 5:42 PM Julie Harrison  MRN:  [id number]  Chief Complaint:  Chief Complaint    Follow-up     HPI: Julie Harrison is a 68 year old Caucasian female, engaged, retired, [place], has a history of bipolar, GAD, social anxiety disorder, hypothyroidism, hypertension was evaluated by phone today.  Patient was offered video call however declined.   Reports she is slightly anxious about the current COVID-19 situation.  She and her fianc are trying to stay indoors mostly.  They however has been staying busy by helping their neighbors, going out for walks and so on.  She reports it is kind of frustrating sometimes.  She just hopes this will get better soon.  She however reports her mood symptoms as okay on the medications.  She is compliant on her Wellbutrin and Prozac.  She denies side effects.  She reports sleep is good.  Patient denies any suicidality, homicidality or perceptual disturbances.  Patient denies any  other concerns today.  Visit Diagnosis:    ICD-10-CM   1. Bipolar 2 disorder, major depressive episode ([place])  F31.81    improving  2. GAD (generalized anxiety disorder)  F41.1   3. Social anxiety disorder  F40.10     Past Psychiatric History: I have reviewed past psychiatric history from my progress note on 03/04/2018.  Past trials of Viibryd, duloxetine, Pristiq, Zoloft, Lexapro, Latuda, Prozac, Brintellix.  Past Medical History:  Past Medical History:  Diagnosis Date  . Hypertension   . 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 Personal history of colonic polyps   . Thyroid disease   . Tobacco dependence   . Vitamin D deficiency    History reviewed. No pertinent surgical history.  Family Psychiatric History: I have reviewed family psychiatric history from my progress note on 03/04/2018.  Family History:  Family History  Problem Relation Age of Onset  . Depression Mother   . Alcohol abuse Father   . Alcohol abuse Paternal Uncle   . Depression Maternal Grandmother     Social History: I have reviewed social history from my progress note on 03/04/2018. Social History   Socioeconomic History  . Marital status: Divorced    Spouse name: Not on file  . Number of children: 2  . Years of education: Not on file  . Highest education level: Associate degree: occupational, Hotel manager, or vocational program  Occupational History    Comment: retired  Scientific laboratory technician  . Financial resource strain: Not hard at all  . Food  insecurity    Worry: Never true    Inability: Never true  . Transportation needs    Medical: No    Non-medical: No  Tobacco Use  . Smoking status: Former Smoker    Packs/day: 0.25    Types: Cigarettes    Quit date: 03/22/2018    Years since quitting: 1.0  . Smokeless tobacco: Never Used  Substance and Sexual Activity  . Alcohol use: Yes    Alcohol/week: 2.0 standard drinks    Types: 2 Glasses of wine per week  .  Drug use: Yes    Types: Marijuana    Comment: about a month ago  . Sexual activity: Yes  Lifestyle  . Physical activity    Days per week: 0 days    Minutes per session: 0 min  . Stress: Not at all  Relationships  . Social connections    Talks on phone: More than three times a week    Gets together: Once a week    Attends religious service: More than 4 times per year    Active member of club or organization: No    Attends meetings of clubs or organizations: Never    Relationship status: Divorced  Other Topics Concern  . Not on file  Social History Narrative  . Not on file    Allergies:  Allergies  Allergen Reactions  . Hydralazine Hcl Itching  . Erythromycin Nausea Only  . Hydrochlorothiazide Itching  . Vortioxetine Nausea Only    Metabolic Disorder Labs: No results found for: HGBA1C, MPG No results found for: PROLACTIN No results found for: CHOL, TRIG, HDL, CHOLHDL, VLDL, LDLCALC No results found for: TSH  Therapeutic Level Labs: No results found for: LITHIUM No results found for: VALPROATE No components found for:  CBMZ  Current Medications: Current Outpatient Medications  Medication Sig Dispense Refill  . alendronate (FOSAMAX) 70 MG tablet Take by mouth.    Marland Kitchen. buPROPion (WELLBUTRIN XL) 300 MG 24 hr tablet Take 1 tablet (300 mg total) by mouth daily. 90 tablet 1  . Calcium Carbonate-Vitamin D (OYSTER SHELL CALCIUM 500 + D) 500-125 MG-UNIT TABS Take by mouth.    Marland Kitchen. FLUoxetine (PROZAC) 10 MG capsule TAKE 1 CAPSULE (10 MG TOTAL) BY MOUTH DAILY WITH BREAKFAST. 90 capsule 1  . levothyroxine (SYNTHROID, LEVOTHROID) 75 MCG tablet TAKE 1 TABLET BY MOUTH EVERY DAY FOR LOW THYROID  2  . lisinopril (PRINIVIL,ZESTRIL) 5 MG tablet Take 5 mg by mouth daily. for high blood pressure  1  . loratadine (CLARITIN) 10 MG tablet Take by mouth.    . pantoprazole (PROTONIX) 40 MG tablet Take 40 mg by mouth daily.    . traZODone (DESYREL) 50 MG tablet TAKE 1/2 TO 1 TABLET (25-50 MG TOTAL)  BY MOUTH AT BEDTIME AS NEEDED FOR SLEEP. 90 tablet 1   No current facility-administered medications for this visit.      Musculoskeletal: Strength & Muscle Tone: UTA Gait & Station: Reports as WNL Patient leans: N/A  Psychiatric Specialty Exam: Review of Systems  Psychiatric/Behavioral: The patient is nervous/anxious.   All other systems reviewed and are negative.   There were no vitals taken for this visit.There is no height or weight on file to calculate BMI.  General Appearance: UTA  Eye Contact:  UTA  Speech:  Clear and Coherent  Volume:  Normal  Mood:  Anxious  Affect:  UTA  Thought Process:  Goal Directed and Descriptions of Associations: Intact  Orientation:  Full (Time, Place, and  Person)  Thought Content: Logical   Suicidal Thoughts:  No  Homicidal Thoughts:  No  Memory:  Immediate;   Fair Recent;   Fair Remote;   Fair  Judgement:  Fair  Insight:  Fair  Psychomotor Activity:  UTA  Concentration:  Concentration: Fair and Attention Span: Fair  Recall:  [employer]  Fund of Knowledge: Fair  Language: Fair  Akathisia:  No  Handed:  Right  AIMS (if indicated): Denies tremors, rigidity  Assets:  Communication Skills Desire for Improvement Housing Social Support  ADL's:  Intact  Cognition: WNL  Sleep:  Fair   Screenings:   Assessment and Plan: Aram BeechamCynthia is a 68 year old CF , who has a history of Bipolar disorder, Anxiety disorder, hypothyroidism was evaluated by phone today. Patient was offered video call but declined. Patient with several psychosocial stressors including the death of her daughter from seizure disorder, current COVID 6819 outbreak.  Patient however is coping okay and has good social support system.  She is compliant on medications.  Plan as noted below.  Plan Bipolar disorder type II-stable Wellbutrin extended release 300 mg p.o. daily Prozac 10 mg p.o. daily-reduced dosage.  For GAD-improving Prozac 10 mg p.o. daily Continue CBT with Ms. Tasia CatchingsCraig as  needed  For social anxiety disorder-improving Continue CBT  Tobacco use disorder in remission-quit date was June 2019 She continues to stay away.  Follow-up in clinic in 3 months or sooner if needed.  September 8 at 10:30 AM  I have spent atleast 15 minutes non face to face with patient today. More than 50 % of the time was spent for psychoeducation and supportive psychotherapy and care coordination.  This note was generated in part or whole with voice recognition software. Voice recognition is usually quite accurate but there are transcription errors that can and very often do occur. I apologize for any typographical errors that were not detected and corrected.        Jomarie LongsSaramma Britton Perkinson, MD 03/31/2019, 5:42 PM

## 2019-04-07 ENCOUNTER — Ambulatory Visit: Payer: Medicare Other | Admitting: Psychiatry

## 2019-04-15 ENCOUNTER — Other Ambulatory Visit: Payer: Self-pay | Admitting: Psychiatry

## 2019-04-15 DIAGNOSIS — F3181 Bipolar II disorder: Secondary | ICD-10-CM

## 2019-04-21 ENCOUNTER — Other Ambulatory Visit: Payer: Self-pay | Admitting: Psychiatry

## 2019-04-24 NOTE — Telephone Encounter (Signed)
Fraser Din has enough prozac for now.

## 2019-05-07 ENCOUNTER — Other Ambulatory Visit: Payer: Self-pay | Admitting: Psychiatry

## 2019-05-07 DIAGNOSIS — F3181 Bipolar II disorder: Secondary | ICD-10-CM

## 2019-05-15 ENCOUNTER — Other Ambulatory Visit: Payer: Self-pay | Admitting: Psychiatry

## 2019-05-15 DIAGNOSIS — F3181 Bipolar II disorder: Secondary | ICD-10-CM

## 2019-06-23 ENCOUNTER — Encounter: Payer: Self-pay | Admitting: Psychiatry

## 2019-06-23 ENCOUNTER — Other Ambulatory Visit: Payer: Self-pay

## 2019-06-23 ENCOUNTER — Ambulatory Visit (INDEPENDENT_AMBULATORY_CARE_PROVIDER_SITE_OTHER): Payer: Medicare Other | Admitting: Psychiatry

## 2019-06-23 DIAGNOSIS — F411 Generalized anxiety disorder: Secondary | ICD-10-CM | POA: Diagnosis not present

## 2019-06-23 DIAGNOSIS — F3181 Bipolar II disorder: Secondary | ICD-10-CM | POA: Insufficient documentation

## 2019-06-23 DIAGNOSIS — F4010 Social phobia, unspecified: Secondary | ICD-10-CM | POA: Insufficient documentation

## 2019-06-23 DIAGNOSIS — F3175 Bipolar disorder, in partial remission, most recent episode depressed: Secondary | ICD-10-CM | POA: Insufficient documentation

## 2019-06-23 NOTE — Progress Notes (Signed)
Virtual Visit via Telephone Note  I connected with Julie Harrison on 06/23/19 at 10:30 AM EDT by telephone and verified that I am speaking with the correct person using two identifiers.   I discussed the limitations, risks, security and privacy concerns of performing an evaluation and management service by telephone and the availability of in person appointments. I also discussed with the patient that there may be a patient responsible charge related to this service. The patient expressed understanding and agreed to proceed.   I discussed the assessment and treatment plan with the patient. The patient was provided an opportunity to ask questions and all were answered. The patient agreed with the plan and demonstrated an understanding of the instructions.   The patient was advised to call back or seek an in-person evaluation if the symptoms worsen or if the condition fails to improve as anticipated.   BH MD OP Progress Note  06/23/2019 11:13 AM Xaviana Newsum  MRN:  [id number]  Chief Complaint:  Chief Complaint    Follow-up     HPI: Julie Harrison is a 68 year old Caucasian female, engaged, retired, lives currently in [place], has a history of bipolar disorder, GAD, social anxiety disorder, hypothyroidism, hypertension was evaluated by phone today.  Patient preferred to do a phone call.  Patient today reports she is currently struggling with some anxiety symptoms.  She reports she is worried about the current pandemic.  She has stopped watching the news since it makes her anxious.  She however reports she will reach out to her therapist to restart psychotherapy sessions again.  She reports the Wellbutrin and Prozac is helpful with her mood.  She denies any side effects to the medication.  Patient denies any suicidality, homicidality or perceptual disturbances.  Patient denies any other concerns today. Visit Diagnosis:    ICD-10-CM   1. Bipolar disorder, in partial remission, most  recent episode depressed (HCC)  F31.75   2. GAD (generalized anxiety disorder)  F41.1   3. Social anxiety disorder  F40.10     Past Psychiatric History: I have reviewed past psychiatric history from my progress note on 03/04/2018.  Past trials of Viibryd, duloxetine, Pristiq, Zoloft, Lexapro, Latuda, Prozac, Brintellix  Past Medical History:  Past Medical History:  Diagnosis Date  . Hypertension   . 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 Personal history of colonic polyps   . Thyroid disease   . Tobacco dependence   . Vitamin D deficiency    History reviewed. No pertinent surgical history.  Family Psychiatric History: I have reviewed family psychiatric history from my progress note on 03/04/2018.  Family History:  Family History  Problem Relation Age of Onset  . Depression Mother   . Alcohol abuse Father   . Alcohol abuse Paternal Uncle   . Depression Maternal Grandmother     Social History: I have reviewed social history from my progress note on 03/04/2018. Social History   Socioeconomic History  . Marital status: Divorced    Spouse name: Not on file  . Number of children: 2  . Years of education: Not on file  . Highest education level: Associate degree: occupational, Scientist, product/process development, or vocational program  Occupational History    Comment: retired  Engineer, production  . Financial resource strain: Not hard at all  . Food insecurity    Worry: Never true    Inability: Never true  . Transportation needs    Medical: No  Non-medical: No  Tobacco Use  . Smoking status: Former Smoker    Packs/day: 0.25    Types: Cigarettes    Quit date: 03/22/2018    Years since quitting: 1.2  . Smokeless tobacco: Never Used  Substance and Sexual Activity  . Alcohol use: Yes    Alcohol/week: 2.0 standard drinks    Types: 2 Glasses of wine per week  . Drug use: Yes    Types: Marijuana    Comment: about a month ago  . Sexual activity: Yes   Lifestyle  . Physical activity    Days per week: 0 days    Minutes per session: 0 min  . Stress: Not at all  Relationships  . Social connections    Talks on phone: More than three times a week    Gets together: Once a week    Attends religious service: More than 4 times per year    Active member of club or organization: No    Attends meetings of clubs or organizations: Never    Relationship status: Divorced  Other Topics Concern  . Not on file  Social History Narrative  . Not on file    Allergies:  Allergies  Allergen Reactions  . Hydralazine Hcl Itching  . Erythromycin Nausea Only  . Hydrochlorothiazide Itching  . Vortioxetine Nausea Only    Metabolic Disorder Labs: No results found for: HGBA1C, MPG No results found for: PROLACTIN No results found for: CHOL, TRIG, HDL, CHOLHDL, VLDL, LDLCALC No results found for: TSH  Therapeutic Level Labs: No results found for: LITHIUM No results found for: VALPROATE No components found for:  CBMZ  Current Medications: Current Outpatient Medications  Medication Sig Dispense Refill  . alendronate (FOSAMAX) 70 MG tablet Take by mouth.    Marland Kitchen. buPROPion (WELLBUTRIN XL) 300 MG 24 hr tablet Take 1 tablet (300 mg total) by mouth daily. 90 tablet 1  . Calcium Carbonate-Vitamin D (OYSTER SHELL CALCIUM 500 + D) 500-125 MG-UNIT TABS Take by mouth.    Marland Kitchen. FLUoxetine (PROZAC) 10 MG capsule TAKE 1 CAPSULE (10 MG TOTAL) BY MOUTH DAILY WITH BREAKFAST. 90 capsule 1  . levothyroxine (SYNTHROID, LEVOTHROID) 75 MCG tablet TAKE 1 TABLET BY MOUTH EVERY DAY FOR LOW THYROID  2  . lisinopril (PRINIVIL,ZESTRIL) 5 MG tablet Take 5 mg by mouth daily. for high blood pressure  1  . loratadine (CLARITIN) 10 MG tablet Take by mouth.    . pantoprazole (PROTONIX) 40 MG tablet Take 40 mg by mouth daily.    . traZODone (DESYREL) 50 MG tablet TAKE 1/2 TO 1 TABLET (25-50 MG TOTAL) BY MOUTH AT BEDTIME AS NEEDED FOR SLEEP. 90 tablet 1   No current facility-administered  medications for this visit.      Musculoskeletal: Strength & Muscle Tone: UTA Gait & Station: Reports as WNL Patient leans: N/A  Psychiatric Specialty Exam: Review of Systems  Psychiatric/Behavioral: Negative for hallucinations, substance abuse and suicidal ideas. The patient is nervous/anxious. The patient does not have insomnia.   All other systems reviewed and are negative.   There were no vitals taken for this visit.There is no height or weight on file to calculate BMI.  General Appearance: UTA  Eye Contact:  UTA  Speech:  Clear and Coherent  Volume:  Normal  Mood:  Anxious  Affect:  UTA  Thought Process:  Goal Directed and Descriptions of Associations: Intact  Orientation:  Full (Time, Place, and Person)  Thought Content: Logical   Suicidal Thoughts:  No  Homicidal Thoughts:  No  Memory:  Immediate;   Fair Recent;   Fair Remote;   Fair  Judgement:  Fair  Insight:  Fair  Psychomotor Activity:  UTA  Concentration:  Concentration: Fair and Attention Span: Fair  Recall:  [employer] of Knowledge: Fair  Language: Fair  Akathisia:  No  Handed:  Right  AIMS (if indicated): Denies tremors, rigidity  Assets:  Communication Skills Desire for Improvement Social Support  ADL's:  Intact  Cognition: WNL  Sleep:  Fair   Screenings:   Assessment and Plan: Julie Harrison is a 68 year old Caucasian female who has a history of bipolar disorder, anxiety disorder, hypothyroidism was evaluated by phone today.  Patient preferred to do a phone call.  Patient with several psychosocial stressors including the death of her daughter from seizure disorder, current COVID-19 outbreak.  Patient will benefit from continued psychotherapy sessions.  She will continue medications as noted below.  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 daily Prozac 10 mg p.o. daily-reduced dosage  GAD- improving Discussed with patient to reach out to Ms. Alden Hipp for restarting psychotherapy sessions. Prozac 10 mg p.o. daily  For social anxiety disorder-improving Continue CBT  Tobacco use disorder in remission-quit date was June 2019 She will continue to stay away.  Follow-up in clinic in 2 months or sooner if needed.  Nov 4 th at 10:15 AM  I have spent atleast 15 minutes non face to face with patient today. More than 50 % of the time was spent for psychoeducation and supportive psychotherapy and care coordination. This note was generated in part or whole with voice recognition software. Voice recognition is usually quite accurate but there are transcription errors that can and very often do occur. I apologize for any typographical errors that were not detected and corrected.       Ursula Alert, MD 06/23/2019, 11:13 AM

## 2019-07-10 ENCOUNTER — Other Ambulatory Visit: Payer: Self-pay | Admitting: Psychiatry

## 2019-07-10 DIAGNOSIS — F3181 Bipolar II disorder: Secondary | ICD-10-CM

## 2019-07-10 MED ORDER — FLUOXETINE HCL 10 MG PO CAPS: 10.0000 mg | ORAL_CAPSULE | Freq: Every day | ORAL | 1 refills | Status: DC

## 2019-07-10 NOTE — Telephone Encounter (Signed)
Sent prozac to pharmacy.

## 2019-08-04 ENCOUNTER — Other Ambulatory Visit: Payer: Self-pay | Admitting: Psychiatry

## 2019-08-04 DIAGNOSIS — F3181 Bipolar II disorder: Secondary | ICD-10-CM

## 2019-08-19 ENCOUNTER — Encounter: Payer: Self-pay | Admitting: Psychiatry

## 2019-08-19 ENCOUNTER — Ambulatory Visit (INDEPENDENT_AMBULATORY_CARE_PROVIDER_SITE_OTHER): Payer: Medicare Other | Admitting: Psychiatry

## 2019-08-19 ENCOUNTER — Other Ambulatory Visit: Payer: Self-pay

## 2019-08-19 DIAGNOSIS — F411 Generalized anxiety disorder: Secondary | ICD-10-CM | POA: Diagnosis not present

## 2019-08-19 DIAGNOSIS — F3176 Bipolar disorder, in full remission, most recent episode depressed: Secondary | ICD-10-CM | POA: Diagnosis not present

## 2019-08-19 DIAGNOSIS — F4010 Social phobia, unspecified: Secondary | ICD-10-CM

## 2019-08-19 NOTE — Progress Notes (Signed)
Virtual Visit via Video Note  I connected with Julie Harrison on 08/19/19 at 10:15 AM EST by a video enabled telemedicine application and verified that I am speaking with the correct person using two identifiers.   I discussed the limitations of evaluation and management by telemedicine and the availability of in person appointments. The patient expressed understanding and agreed to proceed.     I discussed the assessment and treatment plan with the patient. The patient was provided an opportunity to ask questions and all were answered. The patient agreed with the plan and demonstrated an understanding of the instructions.   The patient was advised to call back or seek an in-person evaluation if the symptoms worsen or if the condition fails to improve as anticipated.   BH MD OP Progress Note  08/19/2019 12:27 PM Julie Harrison  MRN:  [id number]  Chief Complaint:  Chief Complaint    Follow-up     HPI: Julie Harrison is a 67 year old Caucasian female, engaged, retired, lives currently in [facility] , has a history of bipolar disorder, GAD, social anxiety disorder, hypothyroidism, hypertension was evaluated by telemedicine today.  Patient today reports she got married on October 10.  She reports she was anxious for a week or so prior to the wedding.  She currently feels much better.  She has been enjoying her time with her husband.  She is happy that she made this decision.  She reports her mood is stable.  She reports sleep is good.  Patient denies any suicidality, homicidality or perceptual disturbances.  Patient reports she continues to work with Ms. Heidi Dach and has upcoming appointment.  She denies any other concerns today.  She is compliant on medications ,reports no side effects at this time. Visit Diagnosis:    ICD-10-CM   1. Bipolar disorder, in full remission, most recent episode depressed (HCC)  F31.76   2. GAD (generalized anxiety disorder)  F41.1   3. Social anxiety  disorder  F40.10     Past Psychiatric History: I have reviewed past psychiatric history from my progress note on 03/04/2018.  Past trials of Viibryd, duloxetine, Pristiq, Zoloft, Lexapro, Latuda, Prozac, Brintellix  Past Medical History:  Past Medical History:  Diagnosis Date  . Hypertension   . 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 Personal history of colonic polyps   . Thyroid disease   . Tobacco dependence   . Vitamin D deficiency    History reviewed. No pertinent surgical history.  Family Psychiatric History: Reviewed family psychiatric history from my progress note on 03/04/2018 Family History:  Family History  Problem Relation Age of Onset  . Depression Mother   . Alcohol abuse Father   . Alcohol abuse Paternal Uncle   . Depression Maternal Grandmother     Social History: Reviewed social history from my progress note on 03/04/2018 Social History   Socioeconomic History  . Marital status: Divorced    Spouse name: Not on file  . Number of children: 2  . Years of education: Not on file  . Highest education level: Associate degree: occupational, Scientist, product/process development, or vocational program  Occupational History    Comment: retired  Engineer, production  . Financial resource strain: Not hard at all  . Food insecurity    Worry: Never true    Inability: Never true  . Transportation needs    Medical: No    Non-medical: No  Tobacco Use  . Smoking status: Former Smoker  Packs/day: 0.25    Types: Cigarettes    Quit date: 03/22/2018    Years since quitting: 1.4  . Smokeless tobacco: Never Used  Substance and Sexual Activity  . Alcohol use: Yes    Alcohol/week: 2.0 standard drinks    Types: 2 Glasses of wine per week  . Drug use: Yes    Types: Marijuana    Comment: about a month ago  . Sexual activity: Yes  Lifestyle  . Physical activity    Days per week: 0 days    Minutes per session: 0 min  . Stress: Not at all  Relationships   . Social connections    Talks on phone: More than three times a week    Gets together: Once a week    Attends religious service: More than 4 times per year    Active member of club or organization: No    Attends meetings of clubs or organizations: Never    Relationship status: Divorced  Other Topics Concern  . Not on file  Social History Narrative  . Not on file    Allergies:  Allergies  Allergen Reactions  . Hydralazine Hcl Itching  . Erythromycin Nausea Only  . Hydralazine Itching  . Hydrochlorothiazide Itching  . Vortioxetine Nausea Only    Metabolic Disorder Labs: No results found for: HGBA1C, MPG No results found for: PROLACTIN No results found for: CHOL, TRIG, HDL, CHOLHDL, VLDL, LDLCALC No results found for: TSH  Therapeutic Level Labs: No results found for: LITHIUM No results found for: VALPROATE No components found for:  CBMZ  Current Medications: Current Outpatient Medications  Medication Sig Dispense Refill  . alendronate (FOSAMAX) 70 MG tablet Take by mouth.    Marland Kitchen buPROPion (WELLBUTRIN XL) 300 MG 24 hr tablet TAKE 1 TABLET BY MOUTH EVERY DAY 90 tablet 1  . Calcium Carbonate-Vitamin D (OYSTER SHELL CALCIUM 500 + D) 500-125 MG-UNIT TABS Take by mouth.    Marland Kitchen FLUoxetine (PROZAC) 10 MG capsule Take 1 capsule (10 mg total) by mouth daily with breakfast. 90 capsule 1  . hyoscyamine (LEVSIN SL) 0.125 MG SL tablet PLACE 1 TABLET UNDER THE TONGUE EVERY FOUR (4) HOURS AS NEEDED FOR CRAMPING OR DIARRHEA.    Marland Kitchen ibandronate (BONIVA) 150 MG tablet Take 150 mg by mouth every morning.    Marland Kitchen levothyroxine (SYNTHROID, LEVOTHROID) 75 MCG tablet TAKE 1 TABLET BY MOUTH EVERY DAY FOR LOW THYROID  2  . lisinopril (PRINIVIL,ZESTRIL) 5 MG tablet Take 5 mg by mouth daily. for high blood pressure  1  . lisinopril (ZESTRIL) 10 MG tablet Take 10 mg by mouth daily.    Marland Kitchen loratadine (CLARITIN) 10 MG tablet Take by mouth.    . pantoprazole (PROTONIX) 40 MG tablet Take 40 mg by mouth daily.     . traZODone (DESYREL) 50 MG tablet TAKE 1/2 TO 1 TABLET (25-50 MG TOTAL) BY MOUTH AT BEDTIME AS NEEDED FOR SLEEP. 90 tablet 1   No current facility-administered medications for this visit.      Musculoskeletal: Strength & Muscle Tone: UTA Gait & Station: normal Patient leans: N/A  Psychiatric Specialty Exam: Review of Systems  Psychiatric/Behavioral: Negative for depression, hallucinations, substance abuse and suicidal ideas. The patient is not nervous/anxious.   All other systems reviewed and are negative.   There were no vitals taken for this visit.There is no height or weight on file to calculate BMI.  General Appearance: Casual  Eye Contact:  Fair  Speech:  Clear and Coherent  Volume:  Normal  Mood:  Euthymic  Affect:  Congruent  Thought Process:  Goal Directed and Descriptions of Associations: Intact  Orientation:  Full (Time, Place, and Person)  Thought Content: Logical   Suicidal Thoughts:  No  Homicidal Thoughts:  No  Memory:  Immediate;   Fair Recent;   Fair Remote;   Fair  Judgement:  Fair  Insight:  Fair  Psychomotor Activity:  Normal  Concentration:  Concentration: Fair and Attention Span: Fair  Recall:  [employer]  Fund of Knowledge: Fair  Language: Fair  Akathisia:  No  Handed:  Right  AIMS (if indicated):Denies tremors, rigidity  Assets:  Communication Skills Desire for Improvement Housing Intimacy Social Support  ADL's:  Intact  Cognition: WNL  Sleep:  Fair   Screenings:   Assessment and Plan: Aram BeechamCynthia is a 68 year old Caucasian female who has a history of bipolar disorder, anxiety disorder, hypothyroidism was evaluated by telemedicine today.  Patient with several psychosocial stressors including the death of her daughter from seizure disorder, current pandemic.  Patient however is currently doing well on the current medication regimen.  Plan Bipolar disorder type II in remission Wellbutrin extended release 300 mg p.o. daily Prozac 10 mg p.o.  daily-reduced dosage  GAD-stable Patient to continue psychotherapy sessions with Ms. Heidi DachKelsey Craig Prozac as prescribed  Social anxiety disorder-stable Continue CBT  Tobacco use disorder in remission-quit date was 12/04/2017 She continues to stay away.  Follow-up in clinic in 3 months or sooner if needed.  February 10 at 10:30 AM  I have spent atleast 15 minutes non  face to face with patient today. More than 50 % of the time was spent for psychoeducation and supportive psychotherapy and care coordination. This note was generated in part or whole with voice recognition software. Voice recognition is usually quite accurate but there are transcription errors that can and very often do occur. I apologize for any typographical errors that were not detected and corrected.       Jomarie LongsSaramma Fantasha Daniele, MD 08/19/2019, 12:27 PM

## 2019-09-18 ENCOUNTER — Other Ambulatory Visit: Payer: Self-pay

## 2019-09-18 ENCOUNTER — Encounter: Payer: Self-pay | Admitting: Licensed Clinical Social Worker

## 2019-09-18 ENCOUNTER — Ambulatory Visit (INDEPENDENT_AMBULATORY_CARE_PROVIDER_SITE_OTHER): Payer: Medicare Other | Admitting: Licensed Clinical Social Worker

## 2019-09-18 DIAGNOSIS — F3176 Bipolar disorder, in full remission, most recent episode depressed: Secondary | ICD-10-CM

## 2019-09-18 NOTE — Progress Notes (Signed)
Virtual Visit via Telephone Note  I connected with Julie Harrison on 09/18/19 at 10:00 AM EST by telephone and verified that I am speaking with the correct person using two identifiers.   I discussed the limitations, risks, security and privacy concerns of performing an evaluation and management service by telephone and the availability of in person appointments. I also discussed with the patient that there may be a patient responsible charge related to this service. The patient expressed understanding and agreed to proceed.  I discussed the assessment and treatment plan with the patient. The patient was provided an opportunity to ask questions and all were answered. The patient agreed with the plan and demonstrated an understanding of the instructions.   The patient was advised to call back or seek an in-person evaluation if the symptoms worsen or if the condition fails to improve as anticipated.  I provided 60 minutes of non-face-to-face time during this encounter.   Alden Hipp, Julie Harrison    THERAPIST PROGRESS NOTE  Session Time: 1000  Participation Level: Active  Behavioral Response: CasualAlertAnxious  Type of Therapy: Individual Therapy  Treatment Goals addressed: Coping  Interventions: Supportive  Summary: Julie Harrison is a 68 y.o. female who presents with continued symptoms related to her diagnosis.  Julie Harrison reports doing well since our last session, and apologized it had been a while since our last session. Julie Harrison validated that idea, and recognized things get busy and difficult--especially with the pandemic. Julie Harrison expressed she and Julie Harrison got married in October after their initial wedding plans were changed due to Julie Harrison restrictions. Julie Harrison notes things are going well in the marriage, and she has been working on being more "laid back," and choosing her battles more easily. Julie Harrison validated this idea and held space for Julie Harrison to discuss what was going well in her marriage. She  reported her husband still has difficulty understanding their sex drives are different, but Julie Harrison has continued to set that boundary and engage when she wants to. Julie Harrison noted she has been communicating more with her daughter (that was adopted out when she was a baby), and really wants to put time and effort into building that relationship more. Julie Harrison noted a lot of guilt around that relationship. Julie Harrison validated Julie Harrison's feelings and encouraged her to attempt to challenge that guilt, and remember feelings are not facts. Julie Harrison expressed understanding and agreement with this information. Julie Harrison went on to note she felt a lot of things coming up about her daughter's death that she felt guilty about, though she knows she shouldn't. Julie Harrison validated this idea, and asked Julie Harrison to begin making a list as these things pop in her head, and we will utilize that as a jumping off point at our next session. Julie Harrison expressed agreement.   Suicidal/Homicidal: No  Therapist Response: Julie Harrison continues to work towards her tx goals but has not yet reached them. We will continue to work on improving distress tolerance and communication skills moving forward.   Plan: Return again in 4 weeks.  Diagnosis: Axis I: Bipolar 2 Disorder    Axis II: No diagnosis    Alden Hipp, Julie Harrison 09/18/2019

## 2019-10-19 ENCOUNTER — Other Ambulatory Visit: Payer: Self-pay

## 2019-10-19 ENCOUNTER — Encounter: Payer: Self-pay | Admitting: Licensed Clinical Social Worker

## 2019-10-19 ENCOUNTER — Ambulatory Visit (INDEPENDENT_AMBULATORY_CARE_PROVIDER_SITE_OTHER): Payer: Medicare Other | Admitting: Licensed Clinical Social Worker

## 2019-10-19 DIAGNOSIS — F411 Generalized anxiety disorder: Secondary | ICD-10-CM

## 2019-10-19 DIAGNOSIS — F3176 Bipolar disorder, in full remission, most recent episode depressed: Secondary | ICD-10-CM | POA: Diagnosis not present

## 2019-10-19 NOTE — Progress Notes (Signed)
Virtual Visit via Video Note  I connected with Julie Harrison on 10/19/19 at 11:00 AM EST by a video enabled telemedicine application and verified that I am speaking with the correct person using two identifiers.   I discussed the limitations of evaluation and management by telemedicine and the availability of in person appointments. The patient expressed understanding and agreed to proceed.    I discussed the assessment and treatment plan with the patient. The patient was provided an opportunity to ask questions and all were answered. The patient agreed with the plan and demonstrated an understanding of the instructions.   The patient was advised to call back or seek an in-person evaluation if the symptoms worsen or if the condition fails to improve as anticipated.  I provided 60 minutes of non-face-to-face time during this encounter.   Heidi Dach, LCSW    THERAPIST PROGRESS NOTE  Session Time: 1100  Participation Level: Active  Behavioral Response: CasualAlertAnxious  Type of Therapy: Individual Therapy  Treatment Goals addressed: Coping  Interventions: Supportive  Summary: Julie Harrison is a 69 y.o. female who presents with continued symptoms related to her diagnosis. Julie Harrison reports doing well since our last session. She reports having a nice holiday season and stated it was nice to see a few family members. She reported after talking with her daughter that she gave up for adoption as a baby, she has started thinking about trauma and how people can overcome certain things. She stated this also got her thinking about her own past, and how her mind and body could be reacting to trauma from her past. LCSW validated Julie Harrison's feelings around these topics, and encouraged her to read the book The Body Keeps the Score to better understand the impacts trauma can have on our minds and bodies. Julie Harrison expressed understanding and agreement with this information. LCSW also explained some  of the components of the book, and discussed the impact trauma can have on [place] as well. Julie Harrison went on to discuss things she has been feeling associated with her mother, and how she can best understand how to be there for her as she ages. We discussed ways to challenge thoughts and to be present without sacrificing her mental health.   Suicidal/Homicidal: No  Therapist Response: Julie Harrison continues to work towards her tx goals but has not yet reached them. We will continue to work on improving emotional regulation skills and communication moving forward.   Plan: Return again in 3 weeks.  Diagnosis: Axis I: Generalized Anxiety Disorder    Axis II: No diagnosis    Heidi Dach, LCSW 10/19/2019

## 2019-11-12 ENCOUNTER — Other Ambulatory Visit: Payer: Self-pay

## 2019-11-12 ENCOUNTER — Ambulatory Visit: Payer: Medicare Other | Admitting: Licensed Clinical Social Worker

## 2019-11-25 ENCOUNTER — Ambulatory Visit (INDEPENDENT_AMBULATORY_CARE_PROVIDER_SITE_OTHER): Payer: Medicare Other | Admitting: Psychiatry

## 2019-11-25 ENCOUNTER — Other Ambulatory Visit: Payer: Self-pay

## 2019-11-25 ENCOUNTER — Encounter: Payer: Self-pay | Admitting: Psychiatry

## 2019-11-25 DIAGNOSIS — F4010 Social phobia, unspecified: Secondary | ICD-10-CM

## 2019-11-25 DIAGNOSIS — F411 Generalized anxiety disorder: Secondary | ICD-10-CM | POA: Diagnosis not present

## 2019-11-25 DIAGNOSIS — F3176 Bipolar disorder, in full remission, most recent episode depressed: Secondary | ICD-10-CM | POA: Diagnosis not present

## 2019-11-25 NOTE — Progress Notes (Signed)
Provider Location : [facility] Patient Location : Home  Virtual Visit via Video Note  I connected with Julie Harrison on 11/25/19 at 10:30 AM EST by a video enabled telemedicine application and verified that I am speaking with the correct person using two identifiers.   I discussed the limitations of evaluation and management by telemedicine and the availability of in person appointments. The patient expressed understanding and agreed to proceed.    I discussed the assessment and treatment plan with the patient. The patient was provided an opportunity to ask questions and all were answered. The patient agreed with the plan and demonstrated an understanding of the instructions.   The patient was advised to call back or seek an in-person evaluation if the symptoms worsen or if the condition fails to improve as anticipated.   BH MD OP Progress Note  11/25/2019 2:24 PM Julie Harrison  MRN:  [id number]  Chief Complaint:  Chief Complaint    Follow-up     HPI: Julie Harrison is a 69 year old Caucasian female, engaged, retired, lives currently in [facility], has a history of bipolar disorder, GAD, social anxiety disorder, hypothyroidism, hypertension was evaluated by telemedicine today.  Patient today reports she is currently struggling with bronchitis.  She was started on course of antibiotic and prednisone and is recovering at this time.  Patient reports January was not a great month for her since it was the death anniversary and birthday of her daughter who passed away.  Patient reports she however was able to push through it and currently feels better.  She has been following up with her therapist and reports therapy sessions is beneficial.  She has upcoming appointment.  Patient denies any suicidality, homicidality or perceptual disturbances.  Patient is compliant on medications.  She reports she does struggle with some sleep issues recently however has been able to get at least 7 hours of sleep.   She does have trazodone available and agrees to take it as needed.  She has not been using it.  Patient denies any other concerns today. Visit Diagnosis:    ICD-10-CM   1. Bipolar disorder, in full remission, most recent episode depressed (HCC)  F31.76   2. GAD (generalized anxiety disorder)  F41.1   3. Social anxiety disorder  F40.10     Past Psychiatric History: I have reviewed past psychiatric history from my progress note on 03/04/2018.  Past trials of Viibryd, duloxetine, Pristiq, Zoloft, Lexapro, Latuda, Prozac, Brintellix  Past Medical History:  Past Medical History:  Diagnosis Date  . Hypertension   . 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 Personal history of colonic polyps   . Thyroid disease   . Tobacco dependence   . Vitamin D deficiency    History reviewed. No pertinent surgical history.  Family Psychiatric History: I have reviewed family psychiatric history from my progress note on 03/04/2018.  Family History:  Family History  Problem Relation Age of Onset  . Depression Mother   . Alcohol abuse Father   . Alcohol abuse Paternal Uncle   . Depression Maternal Grandmother     Social History: I have reviewed social history from my progress note on 03/04/2018. Social History   Socioeconomic History  . Marital status: Divorced    Spouse name: Not on file  . Number of children: 2  . Years of education: Not on file  . Highest education level: Associate degree: occupational, Scientist, product/process development, or vocational program  Occupational History  Comment: retired  Tobacco Use  . Smoking status: Former Smoker    Packs/day: 0.25    Types: Cigarettes    Quit date: 03/22/2018    Years since quitting: 1.6  . Smokeless tobacco: Never Used  Substance and Sexual Activity  . Alcohol use: Yes    Alcohol/week: 2.0 standard drinks    Types: 2 Glasses of wine per week  . Drug use: Yes    Types: Marijuana    Comment: about a month ago   . Sexual activity: Yes  Other Topics Concern  . Not on file  Social History Narrative  . Not on file   Social Determinants of Health   Financial Resource Strain:   . Difficulty of Paying Living Expenses: Not on file  Food Insecurity:   . Worried About Programme researcher, broadcasting/film/video in the Last Year: Not on file  . Ran Out of Food in the Last Year: Not on file  Transportation Needs:   . Lack of Transportation (Medical): Not on file  . Lack of Transportation (Non-Medical): Not on file  Physical Activity:   . Days of Exercise per Week: Not on file  . Minutes of Exercise per Session: Not on file  Stress:   . Feeling of Stress : Not on file  Social Connections:   . Frequency of Communication with Friends and Family: Not on file  . Frequency of Social Gatherings with Friends and Family: Not on file  . Attends Religious Services: Not on file  . Active Member of Clubs or Organizations: Not on file  . Attends Banker Meetings: Not on file  . Marital Status: Not on file    Allergies:  Allergies  Allergen Reactions  . Hydralazine Hcl Itching  . Erythromycin Nausea Only  . Hydralazine Itching  . Hydrochlorothiazide Itching  . Vortioxetine Nausea Only    Metabolic Disorder Labs: No results found for: HGBA1C, MPG No results found for: PROLACTIN No results found for: CHOL, TRIG, HDL, CHOLHDL, VLDL, LDLCALC No results found for: TSH  Therapeutic Level Labs: No results found for: LITHIUM No results found for: VALPROATE No components found for:  CBMZ  Current Medications: Current Outpatient Medications  Medication Sig Dispense Refill  . albuterol (VENTOLIN HFA) 108 (90 Base) MCG/ACT inhaler INHALE 1 2 PUFFS BY MOUTH EVERY 4 HOURS AS NEEDED FOR WHEEZING    . alendronate (FOSAMAX) 70 MG tablet Take by mouth.    Marland Kitchen buPROPion (WELLBUTRIN XL) 300 MG 24 hr tablet TAKE 1 TABLET BY MOUTH EVERY DAY 90 tablet 1  . Calcium Carb-Cholecalciferol (OYSTER SHELL CALCIUM) 500-400 MG-UNIT  TABS Take by mouth.    . Calcium Carbonate-Vitamin D (OYSTER SHELL CALCIUM 500 + D) 500-125 MG-UNIT TABS Take by mouth.    . cefdinir (OMNICEF) 300 MG capsule Take 300 mg by mouth every 12 (twelve) hours.    Marland Kitchen FLUoxetine (PROZAC) 10 MG capsule Take 1 capsule (10 mg total) by mouth daily with breakfast. 90 capsule 1  . hyoscyamine (LEVSIN SL) 0.125 MG SL tablet PLACE 1 TABLET UNDER THE TONGUE EVERY FOUR (4) HOURS AS NEEDED FOR CRAMPING OR DIARRHEA.    Marland Kitchen ibandronate (BONIVA) 150 MG tablet Take 150 mg by mouth every morning.    Marland Kitchen levothyroxine (SYNTHROID, LEVOTHROID) 75 MCG tablet TAKE 1 TABLET BY MOUTH EVERY DAY FOR LOW THYROID  2  . lisinopril (PRINIVIL,ZESTRIL) 5 MG tablet Take 5 mg by mouth daily. for high blood pressure  1  . lisinopril (ZESTRIL) 10 MG  tablet Take 10 mg by mouth daily.    Marland Kitchen loratadine (CLARITIN) 10 MG tablet Take by mouth.    . montelukast (SINGULAIR) 10 MG tablet Take 10 mg by mouth daily.    . pantoprazole (PROTONIX) 40 MG tablet Take 40 mg by mouth daily.    . predniSONE (DELTASONE) 20 MG tablet Take 40 mg by mouth daily.    . traZODone (DESYREL) 50 MG tablet TAKE 1/2 TO 1 TABLET (25-50 MG TOTAL) BY MOUTH AT BEDTIME AS NEEDED FOR SLEEP. 90 tablet 1   No current facility-administered medications for this visit.     Musculoskeletal: Strength & Muscle Tone: UTA Gait & Station: normal Patient leans: N/A  Psychiatric Specialty Exam: Review of Systems  HENT: Positive for congestion.   Respiratory: Positive for cough.   Psychiatric/Behavioral: Positive for sleep disturbance.  All other systems reviewed and are negative.   There were no vitals taken for this visit.There is no height or weight on file to calculate BMI.  General Appearance: Casual  Eye Contact:  Fair  Speech:  Clear and Coherent  Volume:  Normal  Mood:  Euthymic  Affect:  Congruent  Thought Process:  Goal Directed and Descriptions of Associations: Intact  Orientation:  Full (Time, Place, and Person)   Thought Content: Logical   Suicidal Thoughts:  No  Homicidal Thoughts:  No  Memory:  Immediate;   Fair Recent;   Fair Remote;   Fair  Judgement:  Fair  Insight:  Fair  Psychomotor Activity:  Normal  Concentration:  Concentration: Fair and Attention Span: Fair  Recall:  [employer] of Knowledge: Fair  Language: Fair  Akathisia:  No  Handed:  Right  AIMS (if indicated): denies tremors, rigidity  Assets:  Communication Skills Desire for Improvement Housing Social Support  ADL's:  Intact  Cognition: WNL  Sleep:  Restless   Screenings:   Assessment and Plan: Stormie is a 69 year old Caucasian female who has a history of bipolar disorder, anxiety disorder, hypothyroidism was evaluated by telemedicine today.  Patient with psychosocial stressors including the death of her daughter from seizure disorder and the current pandemic.  Patient is currently making progress however does struggle with sleep issues more so because of her bronchiolitis.  Plan as noted below.  Plan Bipolar disorder type II in remission Wellbutrin extended release 300 mg p.o. daily Prozac 10 mg p.o. daily-reduce dosage.  GAD-stable Continue psychotherapy sessions with Ms. Cecilie Lowers Prozac as prescribed  Social anxiety disorder-stable Continue CBT  Tobacco use disorder in remission We will monitor closely.  Follow-up in clinic in 6 weeks or sooner if needed.  March 29 at 11 AM  I have spent atleast 20 minutes non- face to face with patient today. More than 50 % of the time was spent for  ordering medications and test ,psychoeducation and supportive psychotherapy and care coordination,as well as documenting clinical information in electronic health record. This note was generated in part or whole with voice recognition software. Voice recognition is usually quite accurate but there are transcription errors that can and very often do occur. I apologize for any typographical errors that were not detected and  corrected.       Ursula Alert, MD 11/25/2019, 2:24 PM

## 2019-12-01 ENCOUNTER — Ambulatory Visit (INDEPENDENT_AMBULATORY_CARE_PROVIDER_SITE_OTHER): Payer: Medicare Other | Admitting: Licensed Clinical Social Worker

## 2019-12-01 ENCOUNTER — Encounter: Payer: Self-pay | Admitting: Licensed Clinical Social Worker

## 2019-12-01 ENCOUNTER — Other Ambulatory Visit: Payer: Self-pay

## 2019-12-01 DIAGNOSIS — F3176 Bipolar disorder, in full remission, most recent episode depressed: Secondary | ICD-10-CM | POA: Diagnosis not present

## 2019-12-01 NOTE — Progress Notes (Signed)
Virtual Visit via Video Note  I connected with Julie Harrison on 12/01/19 at  1:30 PM EST by a video enabled telemedicine application and verified that I am speaking with the correct person using two identifiers.   I discussed the limitations of evaluation and management by telemedicine and the availability of in person appointments. The patient expressed understanding and agreed to proceed. I discussed the assessment and treatment plan with the patient. The patient was provided an opportunity to ask questions and all were answered. The patient agreed with the plan and demonstrated an understanding of the instructions.   The patient was advised to call back or seek an in-person evaluation if the symptoms worsen or if the condition fails to improve as anticipated.  I provided 60 minutes of non-face-to-face time during this encounter.   Heidi Dach, LCSW    THERAPIST PROGRESS NOTE  Session Time: 1330  Participation Level: Active  Behavioral Response: NeatAlertAnxious  Type of Therapy: Individual Therapy  Treatment Goals addressed: Coping  Interventions: Supportive  Summary: Julie Harrison is a 69 y.o. female who presents with continued symptoms related to her diagnosis. Julie Harrison reports doing well since our last session. sbe reports she has hd a few difficulties including her mother's boyfriend passing away and thinking she had contracted COVID. However, as it turned out, Julie Harrison had an upper respritory infection that lasted several weeks. Julie Harrison reported feeling quite miserable during that time, but was not able to physically do much else. We discussed ways Julie Harrison could challenge her anxious thoughts around "needing to do things," when she is sick moving forward. She reported her mother's friend passing away was more difficult because it got Julie Harrison worrying about her mother and her mental health. LCSw validated Julie Harrison's feelings and encouraged her to check in with her mother  regularly and continue to be emotionally supportive for her. We discussed understanding what you can do versus what you cannot do in situations.   Suicidal/Homicidal: No  Therapist Response: Julie Harrison continues to work towards her tx goals but has not yet reached them. We will continue to work on improving emotional regulation skills and distress tolerance moving forward.   Plan: Return again in 2 weeks.  Diagnosis: Axis I: Bipolar Disorder    Axis II: No diagnosis    Heidi Dach, LCSW 12/01/2019

## 2019-12-17 ENCOUNTER — Other Ambulatory Visit: Payer: Self-pay

## 2019-12-17 ENCOUNTER — Encounter: Payer: Self-pay | Admitting: Licensed Clinical Social Worker

## 2019-12-17 ENCOUNTER — Ambulatory Visit (INDEPENDENT_AMBULATORY_CARE_PROVIDER_SITE_OTHER): Payer: Medicare Other | Admitting: Licensed Clinical Social Worker

## 2019-12-17 DIAGNOSIS — F411 Generalized anxiety disorder: Secondary | ICD-10-CM | POA: Diagnosis not present

## 2019-12-17 DIAGNOSIS — F3176 Bipolar disorder, in full remission, most recent episode depressed: Secondary | ICD-10-CM | POA: Diagnosis not present

## 2019-12-17 NOTE — Progress Notes (Signed)
Virtual Visit via Video Note  I connected with Julie Artis on 12/17/19 at  2:30 PM EST by a video enabled telemedicine application and verified that I am speaking with the correct person using two identifiers.   I discussed the limitations of evaluation and management by telemedicine and the availability of in person appointments. The patient expressed understanding and agreed to proceed.   I discussed the assessment and treatment plan with the patient. The patient was provided an opportunity to ask questions and all were answered. The patient agreed with the plan and demonstrated an understanding of the instructions.   The patient was advised to call back or seek an in-person evaluation if the symptoms worsen or if the condition fails to improve as anticipated.  I provided 60 minutes of non-face-to-face time during this encounter.   Heidi Dach, LCSW    THERAPIST PROGRESS NOTE  Session Time:1430   Participation Level: Active  Behavioral Response: NeatAlertAnxious  Type of Therapy: Individual Therapy  Treatment Goals addressed: Coping  Interventions: CBT  Summary: Julie Harrison is a 69 y.o. female who presents with continued symptoms related to her diagnosis. [name] reports doing well since our last session. She reports her primary stressor recently has been needing/wanting to have a conversation with her husband about the frequency of their intimacy, and not knowing if that was okay to feel, "I guess I should just be happy he finds me attractive still at our age." LCSW validated Julie Harrison's feelings but encouraged her to recognize she doesn't have to feel any way other than the way she feels, and she is not required to be intimate when she does not feel she is in the mood to. Kendell expressed understanding and brought up other aspects of the relationship that she noted were "small silly things," but wasn't sure how to address them. LCSW held space for Julie Harrison to discuss these  issues, and offered insight where appropriate. We discussed ways to utilize assertive communication and boundary setting in order to bring these things up to her husband. Julie Harrison expressed understanding and agreement.   Suicidal/Homicidal: No  Therapist Response: Julie Harrison continues to work towards her tx goals but has not yet reached them. We will continue to work on improving communication and distress tolerance moving forward.   Plan: Return again in 3 weeks.  Diagnosis: Axis I: Generalized Anxiety Disorder    Axis II: No diagnosis    Heidi Dach, LCSW 12/17/2019

## 2020-01-04 ENCOUNTER — Ambulatory Visit (INDEPENDENT_AMBULATORY_CARE_PROVIDER_SITE_OTHER): Payer: Medicare Other | Admitting: Licensed Clinical Social Worker

## 2020-01-04 ENCOUNTER — Other Ambulatory Visit: Payer: Self-pay

## 2020-01-04 ENCOUNTER — Encounter: Payer: Self-pay | Admitting: Licensed Clinical Social Worker

## 2020-01-04 DIAGNOSIS — F411 Generalized anxiety disorder: Secondary | ICD-10-CM | POA: Diagnosis not present

## 2020-01-04 DIAGNOSIS — F3176 Bipolar disorder, in full remission, most recent episode depressed: Secondary | ICD-10-CM

## 2020-01-04 NOTE — Progress Notes (Signed)
Virtual Visit via Telephone Note  I connected with [name] on 01/04/20 at  2:30 PM EDT by telephone and verified that I am speaking with the correct person using two identifiers.   I discussed the limitations, risks, security and privacy concerns of performing an evaluation and management service by telephone and the availability of in person appointments. I also discussed with the patient that there may be a patient responsible charge related to this service. The patient expressed understanding and agreed to proceed.  I discussed the assessment and treatment plan with the patient. The patient was provided an opportunity to ask questions and all were answered. The patient agreed with the plan and demonstrated an understanding of the instructions.   The patient was advised to call back or seek an in-person evaluation if the symptoms worsen or if the condition fails to improve as anticipated.  I provided 55 minutes of non-face-to-face time during this encounter.   Heidi Dach, LCSW    THERAPIST PROGRESS NOTE  Session Time: 1430  Participation Level: Active  Behavioral Response: NeatAlertAnxious  Type of Therapy: Individual Therapy  Treatment Goals addressed: Coping  Interventions: Strength-based  Summary: Nattalie Santiesteban is a 69 y.o. female who presents with continued symptoms related to her diagnosis. Myrtha reports doing well since our last session. Kandra reports there were not too many things that caused her anxiety over the last two weeks. She reports the primary thing that's been on her mind is her relationship with the daughter she adopted out when she was a teenager. She reports she is planning to go visit her on her birthday, which made her realize she'd never celebrated a birthday with her. Ahilyn expressed feeling she might have shut out her daughter in order to avoid thinking about the situation and the grief that goes along with it. LCSW validated Miku's  feelings and recognized how difficult it can be to carry that weight. We discussed ways Annalee could discuss this with her daughter moving forward, and how her daughter would likely respond in a positive way. Dunia expressed understanding and agreement. LCSW held space for loletta to discuss other areas of her life that had been on her mind, and offered insight where appropriate.   Suicidal/Homicidal: No  Therapist Response: Ritu continues to work towards her tx goals but has not yet reached them. We will continue to work on improving emotional regulation skills and distress tolerance moving forward.   Plan: Return again in 2 weeks.  Diagnosis: Axis I: Generalized Anxiety Disorder and Bipolar Disorder    Axis II: No diagnosis    Heidi Dach, LCSW 01/04/2020

## 2020-01-11 ENCOUNTER — Encounter: Payer: Self-pay | Admitting: Psychiatry

## 2020-01-11 ENCOUNTER — Ambulatory Visit (INDEPENDENT_AMBULATORY_CARE_PROVIDER_SITE_OTHER): Payer: Medicare Other | Admitting: Psychiatry

## 2020-01-11 ENCOUNTER — Other Ambulatory Visit: Payer: Self-pay

## 2020-01-11 DIAGNOSIS — F4010 Social phobia, unspecified: Secondary | ICD-10-CM

## 2020-01-11 DIAGNOSIS — F3181 Bipolar II disorder: Secondary | ICD-10-CM

## 2020-01-11 DIAGNOSIS — F411 Generalized anxiety disorder: Secondary | ICD-10-CM

## 2020-01-11 MED ORDER — LAMOTRIGINE 25 MG PO TABS: 25.0000 mg | ORAL_TABLET | Freq: Every day | ORAL | 1 refills | Status: DC

## 2020-01-11 NOTE — Progress Notes (Signed)
Provider Location : [facility] Patient Location : Home  Virtual Visit via Video Note  I connected with Julie Harrison on 01/11/20 at 11:00 AM EDT by a video enabled telemedicine application and verified that I am speaking with the correct person using two identifiers.   I discussed the limitations of evaluation and management by telemedicine and the availability of in person appointments. The patient expressed understanding and agreed to proceed.    I discussed the assessment and treatment plan with the patient. The patient was provided an opportunity to ask questions and all were answered. The patient agreed with the plan and demonstrated an understanding of the instructions.   The patient was advised to call back or seek an in-person evaluation if the symptoms worsen or if the condition fails to improve as anticipated.  [place] MD OP Progress Note  01/11/2020 5:35 PM Julie Harrison  MRN:  [id number]  Chief Complaint:  Chief Complaint    Follow-up     HPI: Julie Harrison is a 69 year old Caucasian female, engaged, retired, lives currently in [facility], has a history of bipolar disorder, GAD, social anxiety disorder, hypothyroidism, hypertension was evaluated by telemedicine today.  Patient today reports she is currently struggling with feeling down ,possibly depressed.  She was recently started on Lipitor and she does not know if that has anything to do with her feeling low.  She also feels tired and fatigued during the day.  She has more down days than good days now since the past few weeks.  She reports sleep is good on the trazodone.  She does not use it much.  She denies any suicidality, homicidality or perceptual disturbances.  She reports she does have psychosocial stressors.  She reports she looks forward to spending time with her daughter who was given away for adoption.  It is her birthday coming up on Good Friday and she is planning to spend time with her on her birthday for the very first  time.  She has been ruminating about the choices that she made as a teenager when she gave her up for adoption however does not think she had other options at that time.  She has been following up with her therapist and reports therapy sessions as helpful.  Patient denies any suicidality, homicidality or perceptual disturbances.  Patient denies any other concerns today. Visit Diagnosis:    ICD-10-CM   1. Bipolar 2 disorder, major depressive episode (HCC)  F31.81 lamoTRIgine (LAMICTAL) 25 MG tablet  2. GAD (generalized anxiety disorder)  F41.1   3. Social anxiety disorder  F40.10     Past Psychiatric History: I have reviewed past psychiatric history from my progress note on 03/04/2018.  Past trials of Viibryd, duloxetine, Pristiq, Zoloft, Lexapro, Latuda, Prozac, Brintellix  Past Medical History:  Past Medical History:  Diagnosis Date  . Hypertension   . 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 Personal history of colonic polyps   . Thyroid disease   . Tobacco dependence   . Vitamin D deficiency    History reviewed. No pertinent surgical history.  Family Psychiatric History: I have reviewed family psychiatric history from my progress note on 03/04/2018  Family History:  Family History  Problem Relation Age of Onset  . Depression Mother   . Alcohol abuse Father   . Alcohol abuse Paternal Uncle   . Depression Maternal Grandmother     Social History: Reviewed social history from my progress note on 03/04/2018 Social  History   Socioeconomic History  . Marital status: Divorced    Spouse name: Not on file  . Number of children: 2  . Years of education: Not on file  . Highest education level: Associate degree: occupational, Scientist, product/process development, or vocational program  Occupational History    Comment: retired  Tobacco Use  . Smoking status: Former Smoker    Packs/day: 0.25    Types: Cigarettes    Quit date: 03/22/2018    Years since  quitting: 1.8  . Smokeless tobacco: Never Used  Substance and Sexual Activity  . Alcohol use: Yes    Alcohol/week: 2.0 standard drinks    Types: 2 Glasses of wine per week  . Drug use: Yes    Types: Marijuana    Comment: about a month ago  . Sexual activity: Yes  Other Topics Concern  . Not on file  Social History Narrative  . Not on file   Social Determinants of Health   Financial Resource Strain:   . Difficulty of Paying Living Expenses:   Food Insecurity:   . Worried About Programme researcher, broadcasting/film/video in the Last Year:   . Barista in the Last Year:   Transportation Needs:   . Freight forwarder (Medical):   Marland Kitchen Lack of Transportation (Non-Medical):   Physical Activity:   . Days of Exercise per Week:   . Minutes of Exercise per Session:   Stress:   . Feeling of Stress :   Social Connections:   . Frequency of Communication with Friends and Family:   . Frequency of Social Gatherings with Friends and Family:   . Attends Religious Services:   . Active Member of Clubs or Organizations:   . Attends Banker Meetings:   Marland Kitchen Marital Status:     Allergies:  Allergies  Allergen Reactions  . Hydralazine Hcl Itching  . Erythromycin Nausea Only  . Hydralazine Itching  . Hydrochlorothiazide Itching  . Vortioxetine Nausea Only    Metabolic Disorder Labs: No results found for: HGBA1C, MPG No results found for: PROLACTIN No results found for: CHOL, TRIG, HDL, CHOLHDL, VLDL, LDLCALC No results found for: TSH  Therapeutic Level Labs: No results found for: LITHIUM No results found for: VALPROATE No components found for:  CBMZ  Current Medications: Current Outpatient Medications  Medication Sig Dispense Refill  . atorvastatin (LIPITOR) 10 MG tablet Take by mouth.    Marland Kitchen albuterol (VENTOLIN HFA) 108 (90 Base) MCG/ACT inhaler INHALE 1 2 PUFFS BY MOUTH EVERY 4 HOURS AS NEEDED FOR WHEEZING    . alendronate (FOSAMAX) 70 MG tablet Take by mouth.    Marland Kitchen atorvastatin  (LIPITOR) 10 MG tablet Take 10 mg by mouth daily.    Marland Kitchen buPROPion (WELLBUTRIN XL) 300 MG 24 hr tablet TAKE 1 TABLET BY MOUTH EVERY DAY 90 tablet 1  . Calcium Carb-Cholecalciferol (OYSTER SHELL CALCIUM) 500-400 MG-UNIT TABS Take by mouth.    . Calcium Carbonate-Vitamin D (OYSTER SHELL CALCIUM 500 + D) 500-125 MG-UNIT TABS Take by mouth.    . cefdinir (OMNICEF) 300 MG capsule Take 300 mg by mouth every 12 (twelve) hours.    Marland Kitchen FLUoxetine (PROZAC) 10 MG capsule Take 1 capsule (10 mg total) by mouth daily with breakfast. 90 capsule 1  . hyoscyamine (LEVSIN SL) 0.125 MG SL tablet PLACE 1 TABLET UNDER THE TONGUE EVERY FOUR (4) HOURS AS NEEDED FOR CRAMPING OR DIARRHEA.    Marland Kitchen ibandronate (BONIVA) 150 MG tablet Take 150 mg by mouth  every morning.    . lamoTRIgine (LAMICTAL) 25 MG tablet Take 1 tablet (25 mg total) by mouth daily. 30 tablet 1  . levothyroxine (SYNTHROID, LEVOTHROID) 75 MCG tablet TAKE 1 TABLET BY MOUTH EVERY DAY FOR LOW THYROID  2  . lisinopril (PRINIVIL,ZESTRIL) 5 MG tablet Take 5 mg by mouth daily. for high blood pressure  1  . lisinopril (ZESTRIL) 10 MG tablet Take 10 mg by mouth daily.    Marland Kitchen loratadine (CLARITIN) 10 MG tablet Take by mouth.    . montelukast (SINGULAIR) 10 MG tablet Take 10 mg by mouth daily.    . pantoprazole (PROTONIX) 40 MG tablet Take 40 mg by mouth daily.    . predniSONE (DELTASONE) 20 MG tablet Take 40 mg by mouth daily.    . traZODone (DESYREL) 50 MG tablet TAKE 1/2 TO 1 TABLET (25-50 MG TOTAL) BY MOUTH AT BEDTIME AS NEEDED FOR SLEEP. 90 tablet 1   No current facility-administered medications for this visit.     Musculoskeletal: Strength & Muscle Tone: UTA Gait & Station: normal Patient leans: N/A  Psychiatric Specialty Exam: Review of Systems  Constitutional: Positive for fatigue.  Psychiatric/Behavioral: Positive for dysphoric mood.  All other systems reviewed and are negative.   There were no vitals taken for this visit.There is no height or weight on  file to calculate BMI.  General Appearance: Casual  Eye Contact:  Fair  Speech:  Clear and Coherent  Volume:  Normal  Mood:  Depressed  Affect:  Congruent  Thought Process:  Goal Directed and Descriptions of Associations: Intact  Orientation:  Full (Time, Place, and Person)  Thought Content: Logical   Suicidal Thoughts:  No  Homicidal Thoughts:  No  Memory:  Immediate;   Fair Recent;   Fair Remote;   Fair  Judgement:  Fair  Insight:  Fair  Psychomotor Activity:  Normal  Concentration:  Concentration: Fair and Attention Span: Fair  Recall:  [employer] of Knowledge: Fair  Language: Fair  Akathisia:  No  Handed:  Right  AIMS (if indicated): UTA  Assets:  Communication Skills Desire for Improvement Housing Intimacy Social Support Talents/Skills  ADL's:  Intact  Cognition: WNL  Sleep:  Fair   Screenings:   Assessment and Plan: Julie Harrison is a 69 year old Caucasian female who has a history of bipolar disorder, anxiety disorder, hypothyroidism was evaluated by telemedicine today.  Patient with psychosocial stressors including the death of her daughter from seizure disorder and the current pandemic.  Patient is currently struggling with low mood, and possible side effects to her new medication for hyperlipidemia.  Discussed the following plan with patient.  Plan Bipolar disorder type II-major depressive episode-unstable Wellbutrin extended release 300 mg p.o. daily Prozac 10 mg p.o. daily-reduced dosage Start Lamictal 25 mg p.o. daily.  However advised patient to change the timing of her Lipitor to evening and also give the medication more time for her body to get used to it.  She could start the Lamictal today or she could wait another 10 days or so and wait and watch if her fatigue improves and thereby its likely her low mood also improves once her body gets used to her Lipitor.   GAD-stable Continue psychotherapy sessions with Ms. Tasia Catchings. Prozac as prescribed  Social anxiety  disorder-stable Continue CBT  Tobacco use disorder in remission We will monitor closely  Follow-up in clinic in 3 weeks or sooner if needed.  I have spent atleast 20 minutes non face to face with  patient today. More than 50 % of the time was spent for preparing to see the patient ( e.g., review of test, records ),  ordering medications and test ,psychoeducation and supportive psychotherapy and care coordination,as well as documenting clinical information in electronic health record. This note was generated in part or whole with voice recognition software. Voice recognition is usually quite accurate but there are transcription errors that can and very often do occur. I apologize for any typographical errors that were not detected and corrected.      Jomarie Longs, MD 01/11/2020, 5:35 PM

## 2020-01-25 ENCOUNTER — Ambulatory Visit (INDEPENDENT_AMBULATORY_CARE_PROVIDER_SITE_OTHER): Payer: Medicare Other | Admitting: Licensed Clinical Social Worker

## 2020-01-25 ENCOUNTER — Other Ambulatory Visit: Payer: Self-pay

## 2020-01-25 DIAGNOSIS — F411 Generalized anxiety disorder: Secondary | ICD-10-CM | POA: Diagnosis not present

## 2020-01-25 DIAGNOSIS — F3181 Bipolar II disorder: Secondary | ICD-10-CM

## 2020-01-26 ENCOUNTER — Encounter: Payer: Self-pay | Admitting: Licensed Clinical Social Worker

## 2020-01-26 NOTE — Progress Notes (Signed)
Virtual Visit via Video Note  I connected with Julie Harrison on 01/26/20 at  2:30 PM EDT by a video enabled telemedicine application and verified that I am speaking with the correct person using two identifiers.   I discussed the limitations of evaluation and management by telemedicine and the availability of in person appointments. The patient expressed understanding and agreed to proceed.    I discussed the assessment and treatment plan with the patient. The patient was provided an opportunity to ask questions and all were answered. The patient agreed with the plan and demonstrated an understanding of the instructions.   The patient was advised to call back or seek an in-person evaluation if the symptoms worsen or if the condition fails to improve as anticipated.  I provided 60 minutes of non-face-to-face time during this encounter.   Heidi Dach, LCSW    THERAPIST PROGRESS NOTE  Session Time: 1330  Participation Level: Active  Behavioral Response: CasualAlertAnxious  Type of Therapy: Individual Therapy  Treatment Goals addressed: Coping  Interventions: Supportive  Summary: Julie Harrison is a 69 y.o. female who presents with continued symptoms related to her diagnosis. Julie Harrison reports doing well since our last session. She reports she went to see her daughter (who was adopted out as a baby), and spent time with her on her birthday for the first time. Julie Harrison reported it was a very lovely experience, and she was able to share things about her life leading up to adopting out her daughter that her daughter had not heard previously. LCSW held space for Julie Harrison to discuss these things, and how they have impacted her since she left. We discussed ways to continue to be involved with her daughter without feeling overwhelmed, and ways to keep appropriate boundaries where needed. Julie Harrison expressed understanding and agreement with all information presented.   Suicidal/Homicidal: No    Therapist Response: Julie Harrison continues to work towards her tx goals but has not yet reached them. We will continue to work on improving emotional regulation skills and distress tolerance moving forward.   Plan: Return again in 2 weeks.  Diagnosis: Axis I: Bipolar Disorder    Axis II: No diagnosis    Heidi Dach, LCSW 01/26/2020

## 2020-02-05 ENCOUNTER — Telehealth: Payer: Medicare Other | Admitting: Psychiatry

## 2020-03-07 ENCOUNTER — Other Ambulatory Visit: Payer: Self-pay

## 2020-03-07 ENCOUNTER — Telehealth (INDEPENDENT_AMBULATORY_CARE_PROVIDER_SITE_OTHER): Payer: Medicare Other | Admitting: Psychiatry

## 2020-03-07 ENCOUNTER — Encounter: Payer: Self-pay | Admitting: Psychiatry

## 2020-03-07 DIAGNOSIS — F411 Generalized anxiety disorder: Secondary | ICD-10-CM | POA: Diagnosis not present

## 2020-03-07 DIAGNOSIS — F3176 Bipolar disorder, in full remission, most recent episode depressed: Secondary | ICD-10-CM

## 2020-03-07 DIAGNOSIS — F4010 Social phobia, unspecified: Secondary | ICD-10-CM

## 2020-03-07 MED ORDER — BUPROPION HCL ER (XL) 300 MG PO TB24: 300.0000 mg | ORAL_TABLET | Freq: Every day | ORAL | 1 refills | Status: DC

## 2020-03-07 MED ORDER — FLUOXETINE HCL 10 MG PO CAPS: 10.0000 mg | ORAL_CAPSULE | Freq: Every day | ORAL | 1 refills | Status: DC

## 2020-03-07 NOTE — Progress Notes (Signed)
Provider Location : [facility] Patient Location : Home  Virtual Visit via Video Note  I connected with Julie Harrison on 03/07/20 at  9:00 AM EDT by a video enabled telemedicine application and verified that I am speaking with the correct person using two identifiers.   I discussed the limitations of evaluation and management by telemedicine and the availability of in person appointments. The patient expressed understanding and agreed to proceed.     I discussed the assessment and treatment plan with the patient. The patient was provided an opportunity to ask questions and all were answered. The patient agreed with the plan and demonstrated an understanding of the instructions.   The patient was advised to call back or seek an in-person evaluation if the symptoms worsen or if the condition fails to improve as anticipated.   [facility] OP Progress Note  03/07/2020 10:32 AM Julie Harrison  MRN:  [id number]  Chief Complaint:  Chief Complaint    Follow-up     HPI: Julie Harrison is a 69 year old Caucasian female, engaged, retired, lives in [place], has a history of bipolar disorder, GAD, social anxiety disorder, hypothyroidism, hypertension was evaluated by telemedicine today.  Patient today reports she is currently struggling with back pain.  She reports she is planning to get an MRI scan of her back soon.  She was on prednisone for a while and is currently off of it.  She reports the pain does keep her anxious.  It also affects her sleep.  She has to wake up in the middle of the night and take medications to help ease her pain or drink a cup of tea .  She continues to have trazodone as needed available which helps to some extent.  She reports she never started the Lamictal since she felt like she did not want to start a new medication.  She reports mood wise she is doing okay and does not want any changes at this time.  She reports she however is interested in starting psychotherapy sessions.   She is aware that her therapist has left the practice.  Patient denies any suicidality, homicidality or perceptual disturbances.  Patient denies any other concerns today.  Visit Diagnosis:    ICD-10-CM   1. Bipolar disorder, in full remission, most recent episode depressed (HCC)  F31.76 buPROPion (WELLBUTRIN XL) 300 MG 24 hr tablet  2. GAD (generalized anxiety disorder)  F41.1 FLUoxetine (PROZAC) 10 MG capsule  3. Social anxiety disorder  F40.10 FLUoxetine (PROZAC) 10 MG capsule    Past Psychiatric History: I have reviewed past psychiatric history from my progress note on 03/04/2018.  Past trials of Viibryd, duloxetine, Pristiq, Zoloft, Lexapro, Latuda, Prozac, Brintellix  Past Medical History:  Past Medical History:  Diagnosis Date  . Hypertension   . 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 Personal history of colonic polyps   . Thyroid disease   . Tobacco dependence   . Vitamin D deficiency    History reviewed. No pertinent surgical history.  Family Psychiatric History: I have reviewed family psychiatric history from my progress note on 03/04/2018  Family History:  Family History  Problem Relation Age of Onset  . Depression Mother   . Alcohol abuse Father   . Alcohol abuse Paternal Uncle   . Depression Maternal Grandmother     Social History: I have reviewed social history from my progress note on 03/04/2018. Social History   Socioeconomic History  . Marital  status: Divorced    Spouse name: Not on file  . Number of children: 2  . Years of education: Not on file  . Highest education level: Associate degree: occupational, Scientist, product/process development, or vocational program  Occupational History    Comment: retired  Tobacco Use  . Smoking status: Former Smoker    Packs/day: 0.25    Types: Cigarettes    Quit date: 03/22/2018    Years since quitting: 1.9  . Smokeless tobacco: Never Used  Substance and Sexual Activity  . Alcohol use: Yes     Alcohol/week: 2.0 standard drinks    Types: 2 Glasses of wine per week  . Drug use: Yes    Types: Marijuana    Comment: about a month ago  . Sexual activity: Yes  Other Topics Concern  . Not on file  Social History Narrative  . Not on file   Social Determinants of Health   Financial Resource Strain:   . Difficulty of Paying Living Expenses:   Food Insecurity:   . Worried About Programme researcher, broadcasting/film/video in the Last Year:   . Barista in the Last Year:   Transportation Needs:   . Freight forwarder (Medical):   Marland Kitchen Lack of Transportation (Non-Medical):   Physical Activity:   . Days of Exercise per Week:   . Minutes of Exercise per Session:   Stress:   . Feeling of Stress :   Social Connections:   . Frequency of Communication with Friends and Family:   . Frequency of Social Gatherings with Friends and Family:   . Attends Religious Services:   . Active Member of Clubs or Organizations:   . Attends Banker Meetings:   Marland Kitchen Marital Status:     Allergies:  Allergies  Allergen Reactions  . Hydralazine Hcl Itching  . Erythromycin Nausea Only  . Hydralazine Itching  . Hydrochlorothiazide Itching  . Vortioxetine Nausea Only    Metabolic Disorder Labs: No results found for: HGBA1C, MPG No results found for: PROLACTIN No results found for: CHOL, TRIG, HDL, CHOLHDL, VLDL, LDLCALC No results found for: TSH  Therapeutic Level Labs: No results found for: LITHIUM No results found for: VALPROATE No components found for:  CBMZ  Current Medications: Current Outpatient Medications  Medication Sig Dispense Refill  . albuterol (VENTOLIN HFA) 108 (90 Base) MCG/ACT inhaler INHALE 1 2 PUFFS BY MOUTH EVERY 4 HOURS AS NEEDED FOR WHEEZING    . alendronate (FOSAMAX) 70 MG tablet Take by mouth.    Marland Kitchen atorvastatin (LIPITOR) 10 MG tablet Take 10 mg by mouth daily.    Marland Kitchen atorvastatin (LIPITOR) 10 MG tablet Take by mouth.    Marland Kitchen buPROPion (WELLBUTRIN XL) 300 MG 24 hr tablet  Take 1 tablet (300 mg total) by mouth daily. 90 tablet 1  . Calcium Carb-Cholecalciferol (OYSTER SHELL CALCIUM) 500-400 MG-UNIT TABS Take by mouth.    . Calcium Carbonate-Vitamin D (OYSTER SHELL CALCIUM 500 + D) 500-125 MG-UNIT TABS Take by mouth.    Marland Kitchen FLUoxetine (PROZAC) 10 MG capsule Take 1 capsule (10 mg total) by mouth daily with breakfast. 90 capsule 1  . gabapentin (NEURONTIN) 100 MG capsule Take 100 mg by mouth 3 (three) times daily.    . hyoscyamine (LEVSIN SL) 0.125 MG SL tablet PLACE 1 TABLET UNDER THE TONGUE EVERY FOUR (4) HOURS AS NEEDED FOR CRAMPING OR DIARRHEA.    Marland Kitchen ibandronate (BONIVA) 150 MG tablet Take 150 mg by mouth every morning.    Marland Kitchen  levothyroxine (SYNTHROID, LEVOTHROID) 75 MCG tablet TAKE 1 TABLET BY MOUTH EVERY DAY FOR LOW THYROID  2  . lisinopril (ZESTRIL) 10 MG tablet Take 10 mg by mouth daily.    Marland Kitchen loratadine (CLARITIN) 10 MG tablet Take by mouth.    . montelukast (SINGULAIR) 10 MG tablet Take 10 mg by mouth daily.    . pantoprazole (PROTONIX) 40 MG tablet Take 40 mg by mouth daily.    . traZODone (DESYREL) 50 MG tablet TAKE 1/2 TO 1 TABLET (25-50 MG TOTAL) BY MOUTH AT BEDTIME AS NEEDED FOR SLEEP. 90 tablet 1  . cefdinir (OMNICEF) 300 MG capsule Take 300 mg by mouth every 12 (twelve) hours.    Marland Kitchen lamoTRIgine (LAMICTAL) 25 MG tablet Take 1 tablet (25 mg total) by mouth daily. (Patient not taking: Reported on 03/07/2020) 30 tablet 1  . lisinopril (PRINIVIL,ZESTRIL) 5 MG tablet Take 5 mg by mouth daily. for high blood pressure  1  . predniSONE (DELTASONE) 20 MG tablet Take 40 mg by mouth daily.     No current facility-administered medications for this visit.     Musculoskeletal: Strength & Muscle Tone: UTA Gait & Station: normal Patient leans: N/A  Psychiatric Specialty Exam: Review of Systems  Musculoskeletal: Positive for back pain.  Psychiatric/Behavioral: Positive for sleep disturbance. The patient is nervous/anxious.   All other systems reviewed and are  negative.   There were no vitals taken for this visit.There is no height or weight on file to calculate BMI.  General Appearance: Bizarre  Eye Contact:  Fair  Speech:  Clear and Coherent  Volume:  Normal  Mood:  Anxious improving  Affect:  Congruent  Thought Process:  Goal Directed and Descriptions of Associations: Intact  Orientation:  Full (Time, Place, and Person)  Thought Content: Logical   Suicidal Thoughts:  No  Homicidal Thoughts:  No  Memory:  Immediate;   Fair Recent;   Fair Remote;   Fair  Judgement:  Fair  Insight:  Fair  Psychomotor Activity:  Normal  Concentration:  Concentration: Fair and Attention Span: Fair  Recall:  [employer] of Knowledge: Fair  Language: Fair  Akathisia:  No  Handed:  Right  AIMS (if indicated): UTA  Assets:  Communication Skills Desire for Improvement Housing Social Support  ADL's:  Intact  Cognition: WNL  Sleep:  Restless due to pain   Screenings:   Assessment and Plan: Julie Harrison is a 69 year old Caucasian female who has a history of bipolar disorder, anxiety disorder, hypothyroidism was evaluated by telemedicine today.  Patient with psychosocial stressors including the death of her daughter from seizure disorder and the current pandemic and her own health issues.  Patient continues to struggle with restlessness during the day and at night due to her pain and is planning to follow-up with her providers for the same.  She otherwise has been noncompliant with the Lamictal which was recently restarted.  Discussed plan as noted below.  Plan Bipolar disorder type II-remission Wellbutrin XL release 300 mg p.o. daily Prozac 10 mg p.o. daily-reduced dosage. Discontinue Lamictal for noncompliance.  GAD-stable Continue CBT, patient encouraged to restart psychotherapy session and establish care with a new therapist. Prozac as prescribed  Social anxiety disorder-stable Continue CBT  Tobacco use disorder in remission We will monitor  closely  Follow-up in clinic in 8 weeks or sooner if needed.  I have spent atleast 20 minutes non face to face with patient today. More than 50 % of the time was spent  for preparing to see the patient ( e.g., review of test, records ),  ordering medications and test ,psychoeducation and supportive psychotherapy and care coordination,as well as documenting clinical information in electronic health record. This note was generated in part or whole with voice recognition software. Voice recognition is usually quite accurate but there are transcription errors that can and very often do occur. I apologize for any typographical errors that were not detected and corrected.       Ursula Alert, MD 03/07/2020, 10:32 AM

## 2020-05-16 ENCOUNTER — Encounter: Payer: Self-pay | Admitting: Psychiatry

## 2020-05-16 ENCOUNTER — Telehealth (INDEPENDENT_AMBULATORY_CARE_PROVIDER_SITE_OTHER): Payer: Medicare Other | Admitting: Psychiatry

## 2020-05-16 ENCOUNTER — Other Ambulatory Visit: Payer: Self-pay

## 2020-05-16 DIAGNOSIS — F411 Generalized anxiety disorder: Secondary | ICD-10-CM | POA: Diagnosis not present

## 2020-05-16 DIAGNOSIS — F4010 Social phobia, unspecified: Secondary | ICD-10-CM

## 2020-05-16 DIAGNOSIS — F3176 Bipolar disorder, in full remission, most recent episode depressed: Secondary | ICD-10-CM

## 2020-05-16 NOTE — Progress Notes (Signed)
Provider Location : [facility] Patient Location : Home  Virtual Visit via Video Note  I connected with Julie Harrison on 05/16/20 at 10:00 AM EDT by a video enabled telemedicine application and verified that I am speaking with the correct person using two identifiers.   I discussed the limitations of evaluation and management by telemedicine and the availability of in person appointments. The patient expressed understanding and agreed to proceed.   I discussed the assessment and treatment plan with the patient. The patient was provided an opportunity to ask questions and all were answered. The patient agreed with the plan and demonstrated an understanding of the instructions.   The patient was advised to call back or seek an in-person evaluation if the symptoms worsen or if the condition fails to improve as anticipated.   BH MD OP Progress Note  05/16/2020 10:15 AM Julie Harrison  MRN:  [id number]  Chief Complaint:  Chief Complaint    Follow-up     HPI: Julie Harrison is a 69 year old Caucasian female, engaged, retired, lives in [place], has a history of bipolar disorder, GAD, social anxiety disorder, hypothyroidism, hypertension was evaluated by telemedicine today.  Patient today reports she is currently doing well on the current medication regimen.  She reports she had a busy month since her grandkids came home.  She reports they kept her busy and she is currently recovering from that.  She reports she continues to struggle with back pain and was recently started on gabapentin.  That helps to some extent.  That does slow her down a little bit and she is currently thinking about getting an injection to her back for pain relief.  She reports she is compliant on the Wellbutrin and the Prozac and wants to stay on the current medication combination.  She denies side effects.  She denies any suicidality, homicidality or perceptual disturbances.  She reports sleep is overall okay.  The pain does  affect her sleep however she has been waking up feeling rested.  She is trying to get exercise by going swimming and also walking.  She has not been able to schedule an appointment with therapist however agrees to get the therapist to call.  She denies any other concerns today.    Visit Diagnosis:    ICD-10-CM   1. Bipolar disorder, in full remission, most recent episode depressed (HCC)  F31.76   2. GAD (generalized anxiety disorder)  F41.1   3. Social anxiety disorder  F40.10     Past Psychiatric History: I have reviewed past psychiatric history from my progress note on 03/04/2018.  Past trials of Viibryd, duloxetine, Pristiq, Zoloft, Lexapro, Latuda, Prozac, Brintellix  Past Medical History:  Past Medical History:  Diagnosis Date  . Hypertension   . 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 Personal history of colonic polyps   . Thyroid disease   . Tobacco dependence   . Vitamin D deficiency    History reviewed. No pertinent surgical history.  Family Psychiatric History: I have reviewed family psychiatric history from my progress note on 03/04/2018  Family History:  Family History  Problem Relation Age of Onset  . Depression Mother   . Alcohol abuse Father   . Alcohol abuse Paternal Uncle   . Depression Maternal Grandmother     Social History: Reviewed social history from my progress note on 03/04/2018 Social History   Socioeconomic History  . Marital status: Divorced    Spouse name:  Not on file  . Number of children: 2  . Years of education: Not on file  . Highest education level: Associate degree: occupational, Scientist, product/process development, or vocational program  Occupational History    Comment: retired  Tobacco Use  . Smoking status: Former Smoker    Packs/day: 0.25    Types: Cigarettes    Quit date: 03/22/2018    Years since quitting: 2.1  . Smokeless tobacco: Never Used  Vaping Use  . Vaping Use: Former  Substance and Sexual  Activity  . Alcohol use: Yes    Alcohol/week: 2.0 standard drinks    Types: 2 Glasses of wine per week  . Drug use: Yes    Types: Marijuana    Comment: about a month ago  . Sexual activity: Yes  Other Topics Concern  . Not on file  Social History Narrative  . Not on file   Social Determinants of Health   Financial Resource Strain:   . Difficulty of Paying Living Expenses:   Food Insecurity:   . Worried About Programme researcher, broadcasting/film/video in the Last Year:   . Barista in the Last Year:   Transportation Needs:   . Freight forwarder (Medical):   Marland Kitchen Lack of Transportation (Non-Medical):   Physical Activity:   . Days of Exercise per Week:   . Minutes of Exercise per Session:   Stress:   . Feeling of Stress :   Social Connections:   . Frequency of Communication with Friends and Family:   . Frequency of Social Gatherings with Friends and Family:   . Attends Religious Services:   . Active Member of Clubs or Organizations:   . Attends Banker Meetings:   Marland Kitchen Marital Status:     Allergies:  Allergies  Allergen Reactions  . Hydralazine Hcl Itching  . Erythromycin Nausea Only  . Hydralazine Itching  . Hydrochlorothiazide Itching  . Vortioxetine Nausea Only    Metabolic Disorder Labs: No results found for: HGBA1C, MPG No results found for: PROLACTIN No results found for: CHOL, TRIG, HDL, CHOLHDL, VLDL, LDLCALC No results found for: TSH  Therapeutic Level Labs: No results found for: LITHIUM No results found for: VALPROATE No components found for:  CBMZ  Current Medications: Current Outpatient Medications  Medication Sig Dispense Refill  . baclofen (LIORESAL) 10 MG tablet Take by mouth.    Marland Kitchen albuterol (VENTOLIN HFA) 108 (90 Base) MCG/ACT inhaler INHALE 1 2 PUFFS BY MOUTH EVERY 4 HOURS AS NEEDED FOR WHEEZING    . alendronate (FOSAMAX) 70 MG tablet Take by mouth.    Marland Kitchen atorvastatin (LIPITOR) 10 MG tablet Take 10 mg by mouth daily.    Marland Kitchen atorvastatin  (LIPITOR) 10 MG tablet Take by mouth.    Marland Kitchen buPROPion (WELLBUTRIN XL) 300 MG 24 hr tablet Take 1 tablet (300 mg total) by mouth daily. 90 tablet 1  . Calcium Carb-Cholecalciferol (OYSTER SHELL CALCIUM) 500-400 MG-UNIT TABS Take by mouth.    . Calcium Carbonate-Vitamin D (OYSTER SHELL CALCIUM 500 + D) 500-125 MG-UNIT TABS Take by mouth.    . cefdinir (OMNICEF) 300 MG capsule Take 300 mg by mouth every 12 (twelve) hours.    Marland Kitchen FLOVENT HFA 110 MCG/ACT inhaler 1 puff 2 (two) times daily.    Marland Kitchen FLUoxetine (PROZAC) 10 MG capsule Take 1 capsule (10 mg total) by mouth daily with breakfast. 90 capsule 1  . gabapentin (NEURONTIN) 100 MG capsule Take 100 mg by mouth 3 (three) times daily.    Marland Kitchen  hyoscyamine (LEVSIN SL) 0.125 MG SL tablet PLACE 1 TABLET UNDER THE TONGUE EVERY FOUR (4) HOURS AS NEEDED FOR CRAMPING OR DIARRHEA.    Marland Kitchen ibandronate (BONIVA) 150 MG tablet Take 150 mg by mouth every morning.    Marland Kitchen levothyroxine (SYNTHROID, LEVOTHROID) 75 MCG tablet TAKE 1 TABLET BY MOUTH EVERY DAY FOR LOW THYROID  2  . lisinopril (PRINIVIL,ZESTRIL) 5 MG tablet Take 5 mg by mouth daily. for high blood pressure  1  . lisinopril (ZESTRIL) 10 MG tablet Take 10 mg by mouth daily.    Marland Kitchen loratadine (CLARITIN) 10 MG tablet Take by mouth.    . montelukast (SINGULAIR) 10 MG tablet Take 10 mg by mouth daily.    . pantoprazole (PROTONIX) 40 MG tablet Take 40 mg by mouth daily.    . predniSONE (DELTASONE) 20 MG tablet Take 40 mg by mouth daily.    . traZODone (DESYREL) 50 MG tablet TAKE 1/2 TO 1 TABLET (25-50 MG TOTAL) BY MOUTH AT BEDTIME AS NEEDED FOR SLEEP. 90 tablet 1   No current facility-administered medications for this visit.     Musculoskeletal: Strength & Muscle Tone: UTA Gait & Station: normal Patient leans: N/A  Psychiatric Specialty Exam: Review of Systems  Musculoskeletal: Positive for back pain.  Psychiatric/Behavioral: Positive for sleep disturbance (due to pain- improving). Negative for agitation, behavioral  problems, confusion, decreased concentration, dysphoric mood, hallucinations, self-injury and suicidal ideas. The patient is not nervous/anxious and is not hyperactive.   All other systems reviewed and are negative.   There were no vitals taken for this visit.There is no height or weight on file to calculate BMI.  General Appearance: Casual  Eye Contact:  Fair  Speech:  Normal Rate  Volume:  Normal  Mood:  Euthymic  Affect:  Appropriate  Thought Process:  Goal Directed and Descriptions of Associations: Intact  Orientation:  Full (Time, Place, and Person)  Thought Content: Logical   Suicidal Thoughts:  No  Homicidal Thoughts:  No  Memory:  Immediate;   Fair Recent;   Fair Remote;   Fair  Judgement:  Fair  Insight:  Fair  Psychomotor Activity:  Normal  Concentration:  Concentration: Fair and Attention Span: Fair  Recall:  [employer] of Knowledge: Fair  Language: Fair  Akathisia:  No  Handed:  Right  AIMS (if indicated): UTA  Assets:  Communication Skills Desire for Improvement Housing Social Support  ADL's:  Intact  Cognition: WNL  Sleep:  restless due to pain - improving   Screenings:   Assessment and Plan: Garyn Waguespack is a 69 year old Caucasian female who has a history of bipolar disorder, anxiety disorder, hypothyroidism was evaluated by telemedicine today.  Patient with psychosocial stressors including the death of her daughter from seizure disorder and the current pandemic, her own health issues like pain.  Patient is currently stable on current medication regimen.  Plan as noted below.  Plan Bipolar disorder type II in remission Wellbutrin XL 300 mg p.o. daily Prozac 10 mg p.o. daily-reduced dosage   GAD-stable Continue CBT.  Patient advised to schedule an appointment since she is interested in restarting psychotherapy sessions again. Prozac as prescribed  Social anxiety disorder-stable Continue CBT as needed  Tobacco use disorder in remission We will  monitor closely  Follow-up in clinic in 2 months or sooner if needed.  I have spent atleast 20 minutes non face to face with patient today. More than 50 % of the time was spent for ordering medications and test ,  psychoeducation and supportive psychotherapy and care coordination,as well as documenting clinical information in electronic health record. This note was generated in part or whole with voice recognition software. Voice recognition is usually quite accurate but there are transcription errors that can and very often do occur. I apologize for any typographical errors that were not detected and corrected.        Jomarie Longs, MD 05/16/2020, 10:15 AM

## 2020-07-21 ENCOUNTER — Telehealth: Payer: Medicare Other | Admitting: Psychiatry

## 2020-08-02 ENCOUNTER — Telehealth: Payer: Medicare Other | Admitting: Psychiatry

## 2020-08-05 ENCOUNTER — Other Ambulatory Visit: Payer: Self-pay

## 2020-08-05 ENCOUNTER — Ambulatory Visit (INDEPENDENT_AMBULATORY_CARE_PROVIDER_SITE_OTHER): Payer: Medicare Other | Admitting: Licensed Clinical Social Worker

## 2020-08-05 DIAGNOSIS — F3176 Bipolar disorder, in full remission, most recent episode depressed: Secondary | ICD-10-CM

## 2020-08-05 NOTE — Progress Notes (Signed)
Virtual Visit via Video Note  I connected with Julie Harrison on 08/05/20 at 11:00 AM EDT by a video enabled telemedicine application and verified that I am speaking with the correct person using two identifiers.  Location: Patient: home Provider: remote office [place], [place])   I discussed the limitations of evaluation and management by telemedicine and the availability of in person appointments. The patient expressed understanding and agreed to proceed.  I discussed the assessment and treatment plan with the patient. The patient was provided an opportunity to ask questions and all were answered. The patient agreed with the plan and demonstrated an understanding of the instructions.   The patient was advised to call back or seek an in-person evaluation if the symptoms worsen or if the condition fails to improve as anticipated.  I provided 45 minutes of non-face-to-face time during this encounter.   Farin Buhman R Jayleene Glaeser, LCSW    THERAPIST PROGRESS NOTE  Session Time: 11:00-11:45a  Participation Level: Active  Behavioral Response: Neat and Well GroomedAlertAnxious  Type of Therapy: Individual Therapy  Treatment Goals addressed: Anxiety  Interventions: Supportive and Other: trauma focused  Summary: Julie Harrison is a 69 y.o. female who presents with stable symptoms related to pts diagnosis of bipolar disorder. Pt reports stable mood and feels that she is managing anxiety/stress well.   Allowed pt to explore and express thoughts and feelings associated with recent external stressors: grief over loss of daughter, feelings triggered after reuniting with daughter that was adopted when pt was 89, relationships with grandchildren, and relationship with husband.   Allowed pt to explore and express thoughts and feelings about multiple situations and relationships throughout lifespan. Discussed how pt feels past is linked with present and future.   Encouraged pt to focus on self care  and keeping life in balance. Pt has lots of family and extended family--encouraged spending quality time with family.   Suicidal/Homicidal: No  SI, HI, or AVH reported at time of session.  Therapist Response: Developed treatment plan with new counselor/revised previous treatment plans.   Plan: Return again in 3 weeks. The ongoing treatment plan includes maintaining current levels of progress and continuing to build skills to manage mood, improve stress/anxiety management, emotion regulation, distress tolerance, and behavior modification.   Diagnosis: Axis I: Bipolar, mixed    Axis II: No diagnosis    [name] Shristi Scheib, LCSW 08/05/2020

## 2020-08-26 ENCOUNTER — Telehealth (INDEPENDENT_AMBULATORY_CARE_PROVIDER_SITE_OTHER): Payer: Medicare Other | Admitting: Psychiatry

## 2020-08-26 ENCOUNTER — Other Ambulatory Visit: Payer: Self-pay

## 2020-08-26 ENCOUNTER — Encounter: Payer: Self-pay | Admitting: Psychiatry

## 2020-08-26 DIAGNOSIS — F411 Generalized anxiety disorder: Secondary | ICD-10-CM | POA: Diagnosis not present

## 2020-08-26 DIAGNOSIS — F4010 Social phobia, unspecified: Secondary | ICD-10-CM

## 2020-08-26 DIAGNOSIS — F3176 Bipolar disorder, in full remission, most recent episode depressed: Secondary | ICD-10-CM | POA: Diagnosis not present

## 2020-08-26 MED ORDER — FLUOXETINE HCL 10 MG PO CAPS: 10.0000 mg | ORAL_CAPSULE | Freq: Every day | ORAL | 1 refills | Status: DC

## 2020-08-26 MED ORDER — BUPROPION HCL ER (XL) 300 MG PO TB24: 300.0000 mg | ORAL_TABLET | Freq: Every day | ORAL | 1 refills | Status: DC

## 2020-08-26 NOTE — Progress Notes (Signed)
Virtual Visit via Video Note  I connected with Julie Harrison on 08/26/20 at  9:40 AM EST by a video enabled telemedicine application and verified that I am speaking with the correct person using two identifiers.  Location Provider Location : [facility] Patient Location : Home  Participants: Patient , Provider  I discussed the limitations of evaluation and management by telemedicine and the availability of in person appointments. The patient expressed understanding and agreed to proceed. I discussed the assessment and treatment plan with the patient. The patient was provided an opportunity to ask questions and all were answered. The patient agreed with the plan and demonstrated an understanding of the instructions.  The patient was advised to call back or seek an in-person evaluation if the symptoms worsen or if the condition fails to improve as anticipated.   BH MD OP Progress Note  08/26/2020 11:41 AM Artie Takayama  MRN:  [id number]  Chief Complaint:  Chief Complaint    Follow-up     HPI: Julie Harrison is a 69 year old Caucasian female, engaged, retired, lives in [place], has a history of bipolar disorder, GAD, social anxiety disorder, hypothyroidism, hypertension was evaluated by telemedicine today.  Patient today reports she is currently doing well.  She however had an episode of depression which lasted for 2 days, this may have been 2 months ago.  She reports she felt really sad and could not do much that time.  She however reports she was able to feel better after a day or 2 and has not had an episode like that since then.  She does report psychosocial stressors of her friend who has cancer .  She reports she and her other friends are trying to support her friend and this may also have triggered it.  Patient reports she has been staying busy with several activities and that does keep her occupied and help her with her mood.  Patient denies any suicidality, homicidality or  perceptual disturbances.  Patient reports she is compliant on medications.  Denies side effects.  She had an appointment with therapist Ms. Christina Hussami and it went well.  She is motivated to keep her therapy sessions.  Patient denies any other concerns today.  Visit Diagnosis:    ICD-10-CM   1. Bipolar disorder, in full remission, most recent episode depressed (HCC)  F31.76 buPROPion (WELLBUTRIN XL) 300 MG 24 hr tablet  2. GAD (generalized anxiety disorder)  F41.1 FLUoxetine (PROZAC) 10 MG capsule  3. Social anxiety disorder  F40.10 FLUoxetine (PROZAC) 10 MG capsule    Past Psychiatric History: I have reviewed past psychiatric history from my progress note on 03/04/2018.  Past trials of Viibryd, duloxetine, Pristiq, Zoloft, Lexapro, Latuda, Prozac, Brintellix  Past Medical History:  Past Medical History:  Diagnosis Date  . Hypertension   . 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 Personal history of colonic polyps   . Thyroid disease   . Tobacco dependence   . Vitamin D deficiency    History reviewed. No pertinent surgical history.  Family Psychiatric History: I have reviewed family psychiatric history from my progress note on 03/04/2018  Family History:  Family History  Problem Relation Age of Onset  . Depression Mother   . Alcohol abuse Father   . Alcohol abuse Paternal Uncle   . Depression Maternal Grandmother     Social History: Reviewed social history from my progress note on 03/04/2018 Social History   Socioeconomic History  .  Marital status: Divorced    Spouse name: Not on file  . Number of children: 2  . Years of education: Not on file  . Highest education level: Associate degree: occupational, Scientist, product/process development, or vocational program  Occupational History    Comment: retired  Tobacco Use  . Smoking status: Former Smoker    Packs/day: 0.25    Types: Cigarettes    Quit date: 03/22/2018    Years since quitting:  2.4  . Smokeless tobacco: Never Used  Vaping Use  . Vaping Use: Former  Substance and Sexual Activity  . Alcohol use: Yes    Alcohol/week: 2.0 standard drinks    Types: 2 Glasses of wine per week  . Drug use: Yes    Types: Marijuana    Comment: about a month ago  . Sexual activity: Yes  Other Topics Concern  . Not on file  Social History Narrative  . Not on file   Social Determinants of Health   Financial Resource Strain:   . Difficulty of Paying Living Expenses: Not on file  Food Insecurity:   . Worried About Programme researcher, broadcasting/film/video in the Last Year: Not on file  . Ran Out of Food in the Last Year: Not on file  Transportation Needs:   . Lack of Transportation (Medical): Not on file  . Lack of Transportation (Non-Medical): Not on file  Physical Activity:   . Days of Exercise per Week: Not on file  . Minutes of Exercise per Session: Not on file  Stress:   . Feeling of Stress : Not on file  Social Connections:   . Frequency of Communication with Friends and Family: Not on file  . Frequency of Social Gatherings with Friends and Family: Not on file  . Attends Religious Services: Not on file  . Active Member of Clubs or Organizations: Not on file  . Attends Banker Meetings: Not on file  . Marital Status: Not on file    Allergies:  Allergies  Allergen Reactions  . Hydralazine Hcl Itching  . Erythromycin Nausea Only  . Hydralazine Itching  . Hydrochlorothiazide Itching  . Vortioxetine Nausea Only    Metabolic Disorder Labs: No results found for: HGBA1C, MPG No results found for: PROLACTIN No results found for: CHOL, TRIG, HDL, CHOLHDL, VLDL, LDLCALC No results found for: TSH  Therapeutic Level Labs: No results found for: LITHIUM No results found for: VALPROATE No components found for:  CBMZ  Current Medications: Current Outpatient Medications  Medication Sig Dispense Refill  . baclofen (LIORESAL) 10 MG tablet TAKE 1/2 TABLET BY MOUTH 3 TIMES A DAY  AS NEEDED FOR 30 DAYS. TAKE NIGHT AT FIRST TO MAKE SURE NO DROWSINESS    . albuterol (VENTOLIN HFA) 108 (90 Base) MCG/ACT inhaler INHALE 1 2 PUFFS BY MOUTH EVERY 4 HOURS AS NEEDED FOR WHEEZING    . alendronate (FOSAMAX) 70 MG tablet Take by mouth.    Marland Kitchen atorvastatin (LIPITOR) 10 MG tablet Take 10 mg by mouth daily.    Marland Kitchen atorvastatin (LIPITOR) 10 MG tablet Take by mouth.    Marland Kitchen buPROPion (WELLBUTRIN XL) 300 MG 24 hr tablet Take 1 tablet (300 mg total) by mouth daily. 90 tablet 1  . Calcium Carb-Cholecalciferol (OYSTER SHELL CALCIUM) 500-400 MG-UNIT TABS Take by mouth.    . Calcium Carbonate-Vitamin D (OYSTER SHELL CALCIUM 500 + D) 500-125 MG-UNIT TABS Take by mouth.    . cefdinir (OMNICEF) 300 MG capsule Take 300 mg by mouth every 12 (twelve)  hours.    Marland Kitchen. FLOVENT HFA 110 MCG/ACT inhaler 1 puff 2 (two) times daily.    Marland Kitchen. FLUoxetine (PROZAC) 10 MG capsule Take 1 capsule (10 mg total) by mouth daily with breakfast. 90 capsule 1  . gabapentin (NEURONTIN) 100 MG capsule Take 100 mg by mouth 3 (three) times daily.    . hyoscyamine (LEVSIN SL) 0.125 MG SL tablet PLACE 1 TABLET UNDER THE TONGUE EVERY FOUR (4) HOURS AS NEEDED FOR CRAMPING OR DIARRHEA.    Marland Kitchen. ibandronate (BONIVA) 150 MG tablet Take 150 mg by mouth every morning.    Marland Kitchen. levothyroxine (SYNTHROID, LEVOTHROID) 75 MCG tablet TAKE 1 TABLET BY MOUTH EVERY DAY FOR LOW THYROID  2  . lisinopril (PRINIVIL,ZESTRIL) 5 MG tablet Take 5 mg by mouth daily. for high blood pressure  1  . lisinopril (ZESTRIL) 10 MG tablet Take 10 mg by mouth daily.    Marland Kitchen. loratadine (CLARITIN) 10 MG tablet Take by mouth.    . montelukast (SINGULAIR) 10 MG tablet Take 10 mg by mouth daily.    . pantoprazole (PROTONIX) 40 MG tablet Take 40 mg by mouth daily.    . predniSONE (DELTASONE) 20 MG tablet Take 40 mg by mouth daily.    . traZODone (DESYREL) 50 MG tablet TAKE 1/2 TO 1 TABLET (25-50 MG TOTAL) BY MOUTH AT BEDTIME AS NEEDED FOR SLEEP. 90 tablet 1   No current facility-administered  medications for this visit.     Musculoskeletal: Strength & Muscle Tone: UTA Gait & Station: normal Patient leans: N/A  Psychiatric Specialty Exam: Review of Systems  Psychiatric/Behavioral: Negative for agitation, behavioral problems, confusion, decreased concentration, dysphoric mood, hallucinations, self-injury, sleep disturbance and suicidal ideas. The patient is not nervous/anxious and is not hyperactive.   All other systems reviewed and are negative.   There were no vitals taken for this visit.There is no height or weight on file to calculate BMI.  General Appearance: Casual  Eye Contact:  Fair  Speech:  Clear and Coherent  Volume:  Normal  Mood:  Euthymic  Affect:  Congruent  Thought Process:  Goal Directed and Descriptions of Associations: Intact  Orientation:  Full (Time, Place, and Person)  Thought Content: Logical   Suicidal Thoughts:  No  Homicidal Thoughts:  No  Memory:  Immediate;   Fair Recent;   Fair Remote;   Fair  Judgement:  Fair  Insight:  Fair  Psychomotor Activity:  Normal  Concentration:  Concentration: Fair and Attention Span: Fair  Recall:  [employer]  Fund of Knowledge: Fair  Language: Fair  Akathisia:  No  Handed:  Right  AIMS (if indicated): UTA  Assets:  Communication Skills Desire for Improvement Social Support  ADL's:  Intact  Cognition: WNL  Sleep:  Fair   Screenings:   Assessment and Plan: Julie InglesCynthia Harrison is a 69 year old Caucasian female who has a history of bipolar disorder, GAD, hypothyroidism was evaluated by telemedicine today.  Patient with psychosocial stressors including the death of her daughter from seizure disorder, current pandemic,friend's cancer diagnosis, her own health issues like pain.  Patient is currently stable on the current medication regimen.  Plan as noted below.  Plan Bipolar disorder type II in remission Wellbutrin XL 300 mg p.o. daily Prozac 10 mg p.o. daily-reduced dosage  GAD-stable Continue CBT with Ms.  Christina Hussami Prozac as prescribed  Social anxiety disorder-stable Continue CBT  Follow-up in clinic in 3 months or sooner if needed.  I have spent atleast 20 minutes face to face by  video with patient today. More than 50 % of the time was spent for preparing to see the patient ( e.g., review of test, records ), ordering medications and test ,psychoeducation and supportive psychotherapy and care coordination,as well as documenting clinical information in electronic health record. This note was generated in part or whole with voice recognition software. Voice recognition is usually quite accurate but there are transcription errors that can and very often do occur. I apologize for any typographical errors that were not detected and corrected.        Jomarie Longs, MD 08/26/2020, 11:41 AM

## 2020-09-01 ENCOUNTER — Ambulatory Visit (INDEPENDENT_AMBULATORY_CARE_PROVIDER_SITE_OTHER): Payer: Medicare Other | Admitting: Licensed Clinical Social Worker

## 2020-09-01 ENCOUNTER — Other Ambulatory Visit: Payer: Self-pay

## 2020-09-01 ENCOUNTER — Telehealth: Payer: Self-pay | Admitting: Psychiatry

## 2020-09-01 DIAGNOSIS — F3181 Bipolar II disorder: Secondary | ICD-10-CM

## 2020-09-01 DIAGNOSIS — F3176 Bipolar disorder, in full remission, most recent episode depressed: Secondary | ICD-10-CM

## 2020-09-01 DIAGNOSIS — F411 Generalized anxiety disorder: Secondary | ICD-10-CM | POA: Diagnosis not present

## 2020-09-01 MED ORDER — LAMOTRIGINE 25 MG PO TABS: 25.0000 mg | ORAL_TABLET | Freq: Every day | ORAL | 1 refills | Status: DC

## 2020-09-01 NOTE — Telephone Encounter (Signed)
Returned call to patient since I received a message from her therapist that she is not doing well.  Patient reports recently she has been thinking a lot about her daughter who passed away and her dad.  She reports she does struggle with some concentration problems, memory changes and mood lability and anxiety.  She reports she has been having forgetfulness, misplacing things at home and losing her train of thought when she is talking to someone.  This is not sudden and this is getting worse gradually.  Discuss restarting Lamictal 25 mg p.o. daily.  She is agreeable.  We will also refer her for neurology consultation for memory loss.

## 2020-09-01 NOTE — Progress Notes (Addendum)
Virtual Visit via Video Note  I connected with Julie Harrison on 09/01/20 at 10:00 AM EST by a video enabled telemedicine application and verified that I am speaking with the correct person using two identifiers.  Location: Patient: home Provider: [facility]  Session Participants: Patient--Julie Harrison Counselor--Julie Harrison, MSW, LCSW    I discussed the limitations of evaluation and management by telemedicine and the availability of in person appointments. The patient expressed understanding and agreed to proceed.   The patient was advised to call back or seek an in-person evaluation if the symptoms worsen or if the condition fails to improve as anticipated.  I provided 50 minutes of non-face-to-face time during this encounter.   Julie Harrison Julie Avnoor Koury, LCSW    THERAPIST PROGRESS NOTE  Session Time: 10:00-10:51a  Participation Level: Active  Behavioral Response: Neat and Well GroomedAlertAnxious and Depressed  Type of Therapy: Individual Therapy  Treatment Goals addressed: Anxiety and Coping  Interventions: CBT, Supportive and Reframing  Summary: Julie Harrison is a 69 y.o. female who presents with symptoms consistent with bipolar disorder diagnosis. Pt also reports symptoms of anxiety. Pt reported feeling lack of motivation/initiative, issues with focus/concentration, and forgetfulness. Pt also feels like she is in a constant state of worrying. Pt reports that she had an appt with psychiatrist recently but forgot to mention the issues that she was experiencing.  Allowed pt safe place to explore thoughts and feelings about current stressors and past stressors. Pt is currently helping a friend that has a terminal illness and pt feels that knowing that her friend is going to die has triggered trauma response from similar caregiving situations pt has been in previously. Discussions triggered thoughts and feelings about father's death. Allowed pt to carefully disclose as much  detail as she felt comfortable expressing about death/dying process with father.   Encouraged pt to focus on self-care and healthy grieving.Encouraged positive social supports  Suicidal/Homicidal: No  SI, HI, or AVH reported at time of session.  Therapist Response: Julie Harrison reports a continuation of symptoms, which is reflective of intermittent/fluctuating progress. Consultation w/ psychiatrist and revision of treatment plan.   Plan: Return again in 3 weeks. The ongoing treatment plan includes maintaining current levels of progress and continuing to build skills to manage mood, improve stress/anxiety management, emotion regulation, distress tolerance, and behavior modification.   Diagnosis: Axis I: Bipolar, Depressed and Generalized Anxiety Disorder    Axis II: No diagnosis    Julie Haber Maddon Horton, LCSW 09/01/2020

## 2020-09-16 ENCOUNTER — Other Ambulatory Visit: Payer: Self-pay | Admitting: Psychiatry

## 2020-09-16 DIAGNOSIS — F3181 Bipolar II disorder: Secondary | ICD-10-CM

## 2020-09-22 ENCOUNTER — Ambulatory Visit: Payer: Medicare Other | Admitting: Licensed Clinical Social Worker

## 2020-11-24 ENCOUNTER — Telehealth (INDEPENDENT_AMBULATORY_CARE_PROVIDER_SITE_OTHER): Payer: Medicare Other | Admitting: Psychiatry

## 2020-11-24 ENCOUNTER — Other Ambulatory Visit: Payer: Self-pay

## 2020-11-24 ENCOUNTER — Encounter: Payer: Self-pay | Admitting: Psychiatry

## 2020-11-24 DIAGNOSIS — F411 Generalized anxiety disorder: Secondary | ICD-10-CM | POA: Diagnosis not present

## 2020-11-24 DIAGNOSIS — F4010 Social phobia, unspecified: Secondary | ICD-10-CM | POA: Diagnosis not present

## 2020-11-24 DIAGNOSIS — F3176 Bipolar disorder, in full remission, most recent episode depressed: Secondary | ICD-10-CM | POA: Diagnosis not present

## 2020-11-24 NOTE — Progress Notes (Signed)
Virtual Visit via Video Note  I connected with Julie Harrison on 11/24/20 at 10:00 AM EST by a video enabled telemedicine application and verified that I am speaking with the correct person using two identifiers.  Location Provider Location : [facility] Patient Location : Home  Participants: Patient , Provider   I discussed the limitations of evaluation and management by telemedicine and the availability of in person appointments. The patient expressed understanding and agreed to proceed.   I discussed the assessment and treatment plan with the patient. The patient was provided an opportunity to ask questions and all were answered. The patient agreed with the plan and demonstrated an understanding of the instructions.   The patient was advised to call back or seek an in-person evaluation if the symptoms worsen or if the condition fails to improve as anticipated.   [place] MD OP Progress Note  11/24/2020 6:44 PM Kayelynn Abdou  MRN:  [id number]  Chief Complaint:  Chief Complaint    Follow-up     HPI: Julie Harrison is a 70 year old Caucasian female, engaged, retired, lives in [place], has a history of bipolar disorder, GAD, social anxiety disorder, hypothyroidism, hypertension was evaluated by telemedicine today.  Patient today reports she has not started the Lamictal that was prescribed in November.  She reports soon after that she had several situations that happened in her life that made her very busy.  She reports her friend needed her at that time since her friend was struggling with terminal illness.  She reports that her friend passed away in 25-Oct-2023.  She reports after that her uncle passed away soon after that.  She reports she was able to get through Nov 24, 2022 which was the death anniversary of her daughter.  She reports she has started journaling on a daily basis.  She is reading this book called "simple abundance.'  That has been very helpful.  Her husband has observed a positive change  in her .  Patient hence does not believe she needs the Lamictal at this point.  She is currently doing well on the current combination of medication.  She reports sleep is overall okay.  Some nights she has difficulty falling asleep.  She does have trazodone available as needed.  She denies any suicidality, homicidality or perceptual disturbances.  Patient denies any other concerns today.    Visit Diagnosis:    ICD-10-CM   1. Bipolar disorder, in full remission, most recent episode depressed ([place])  F31.76   2. GAD (generalized anxiety disorder)  F41.1   3. Social anxiety disorder  F40.10     Past Psychiatric History: I have reviewed past psychiatric history from my progress note on 03/04/2018.  Past trials of Viibryd, duloxetine, Pristiq, Zoloft, Lexapro, Latuda, Prozac, Brintellix.  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 No pertinent surgical history.  Family Psychiatric History: I have reviewed family psychiatric history from my progress note on 03/04/2018  Family History:  Family History  Problem Relation Age of Onset   Depression Mother    Alcohol abuse Father    Alcohol abuse Paternal Uncle    Depression Maternal Grandmother     Social History: Reviewed social history from my progress note on 03/04/2018  Social History   Socioeconomic History   Marital status: Divorced    Spouse name: Not on file   Number of children: 2   Years of education: Not on file   Highest education level: Associate degree: occupational, Hotel manager, or vocational program  Occupational History    Comment: retired  Tobacco Use   Smoking status: Former Smoker    Packs/day: 0.25    Types: Cigarettes    Quit date: 03/22/2018     Years since quitting: 2.6   Smokeless tobacco: Never Used  Vaping Use   Vaping Use: Former  Substance and Sexual Activity   Alcohol use: Yes    Alcohol/week: 2.0 standard drinks    Types: 2 Glasses of wine per week   Drug use: Yes    Types: Marijuana    Comment: about a month ago   Sexual activity: Yes  Other Topics Concern   Not on file  Social History Narrative   Not on file   Social Determinants of Health   Financial Resource Strain: Not on file  Food Insecurity: Not on file  Transportation Needs: Not on file  Physical Activity: Not on file  Stress: Not on file  Social Connections: Not on file    Allergies:  Allergies  Allergen Reactions   Hydralazine Hcl Itching   Erythromycin Nausea Only   Hydralazine Itching   Hydrochlorothiazide Itching   Vortioxetine Nausea Only    Metabolic Disorder Labs: No results found for: HGBA1C, MPG No results found for: PROLACTIN No results found for: CHOL, TRIG, HDL, CHOLHDL, VLDL, LDLCALC No results found for: TSH  Therapeutic Level Labs: No results found for: LITHIUM No results found for: VALPROATE No components found for:  CBMZ  Current Medications: Current Outpatient Medications  Medication Sig Dispense Refill   albuterol (VENTOLIN HFA) 108 (90 Base) MCG/ACT inhaler INHALE 1 2 PUFFS BY MOUTH EVERY 4 HOURS AS NEEDED FOR WHEEZING     alendronate (FOSAMAX) 70 MG tablet Take by mouth.     atorvastatin (LIPITOR) 10 MG tablet Take 10 mg by mouth daily.     atorvastatin (LIPITOR) 10 MG tablet Take by mouth.     baclofen (LIORESAL) 10 MG tablet TAKE 1/2 TABLET BY MOUTH 3 TIMES A DAY AS NEEDED FOR 30 DAYS. TAKE NIGHT AT FIRST TO MAKE SURE NO DROWSINESS     buPROPion (WELLBUTRIN XL) 300 MG 24 hr tablet Take 1 tablet (300 mg total) by mouth daily. 90 tablet 1   Calcium Carb-Cholecalciferol (OYSTER SHELL CALCIUM) 500-400 MG-UNIT TABS Take by mouth.     Calcium Carbonate-Vitamin D (OYSTER SHELL CALCIUM 500 + D)  500-125 MG-UNIT TABS Take by mouth.     cefdinir (OMNICEF) 300 MG capsule Take 300 mg by mouth every 12 (twelve) hours.     COVID-19 Specimen Collection KIT TEST AS DIRECTED TODAY     FLOVENT HFA 110 MCG/ACT inhaler 1 puff 2 (two) times daily.     FLUoxetine (PROZAC) 10 MG capsule Take 1 capsule (10 mg total) by mouth daily with breakfast. 90 capsule 1   gabapentin (NEURONTIN) 100 MG capsule Take 100 mg by mouth 3 (three) times daily.     hyoscyamine (LEVSIN SL) 0.125 MG SL tablet PLACE 1 TABLET UNDER THE TONGUE EVERY FOUR (4) HOURS AS NEEDED FOR CRAMPING OR DIARRHEA.     ibandronate (BONIVA) 150 MG tablet Take 150 mg by mouth every morning.     levothyroxine (SYNTHROID, LEVOTHROID) 75 MCG tablet TAKE 1 TABLET BY MOUTH EVERY  DAY FOR LOW THYROID  2   lisinopril (PRINIVIL,ZESTRIL) 5 MG tablet Take 5 mg by mouth daily. for high blood pressure  1   lisinopril (ZESTRIL) 10 MG tablet Take 10 mg by mouth daily.     loratadine (CLARITIN) 10 MG tablet Take by mouth.     montelukast (SINGULAIR) 10 MG tablet Take 10 mg by mouth daily.     pantoprazole (PROTONIX) 40 MG tablet Take 40 mg by mouth daily.     predniSONE (DELTASONE) 20 MG tablet Take 40 mg by mouth daily.     traZODone (DESYREL) 50 MG tablet TAKE 1/2 TO 1 TABLET (25-50 MG TOTAL) BY MOUTH AT BEDTIME AS NEEDED FOR SLEEP. 90 tablet 1   No current facility-administered medications for this visit.     Musculoskeletal: Strength & Muscle Tone: UTA Gait & Station: UTA Patient leans: N/A  Psychiatric Specialty Exam: Review of Systems  Psychiatric/Behavioral: Negative for agitation, behavioral problems, confusion, decreased concentration, dysphoric mood, hallucinations, self-injury, sleep disturbance and suicidal ideas. The patient is not nervous/anxious and is not hyperactive.   All other systems reviewed and are negative.   There were no vitals taken for this visit.There is no height or weight on file to calculate BMI.   General Appearance: Casual  Eye Contact:  Fair  Speech:  Clear and Coherent  Volume:  Normal  Mood:  Euthymic  Affect:  Congruent  Thought Process:  Goal Directed and Descriptions of Associations: Intact  Orientation:  Full (Time, Place, and Person)  Thought Content: Logical   Suicidal Thoughts:  No  Homicidal Thoughts:  No  Memory:  Immediate;   Fair Recent;   Fair Remote;   Fair  Judgement:  Fair  Insight:  Fair  Psychomotor Activity:  Normal  Concentration:  Concentration: Fair and Attention Span: Fair  Recall:  [employer] of Knowledge: Fair  Language: Fair  Akathisia:  No  Handed:  Right  AIMS (if indicated): UTA  Assets:  Communication Skills Desire for Improvement Housing Social Support  ADL's:  Intact  Cognition: WNL  Sleep:  Fair   Screenings: PHQ2-9   Flowsheet Row Video Visit from 11/24/2020 in [facility]  PHQ-2 Total Score 0    Flowsheet Row Video Visit from 11/24/2020 in [facility] No Risk       Assessment and Plan: Latash Nouri is a 70 year old Caucasian female who has a history of bipolar disorder, GAD, hypothyroidism was evaluated by telemedicine today.  Patient with psychosocial stressors of recent death of her friend, current pandemic, is currently grieving the loss of her friend however is coping well.  Patient also reports being stable on her current medication regimen, has been noncompliant with recent medication changes.  Discussed plan as noted below.  Plan Bipolar disorder type II in remission Wellbutrin XL 300 mg p.o. daily Prozac 10 mg p.o. daily-reduced dosage Discontinue Lamictal for noncompliance  GAD-stable Continue CBT with Ms. Christina Hussami Prozac as prescribed  Social anxiety disorder-stable Continue CBT  Follow-up in clinic in 3  months or sooner if needed.  I have spent atleast 20 minutes face to face by video with patient today. More  than 50 % of the time was spent for preparing to see the patient ( e.g., review of test, records ), ordering medications and test ,psychoeducation and supportive psychotherapy and care coordination,as well as documenting clinical information in electronic health record. This note was generated in part or whole with  voice recognition software. Voice recognition is usually quite accurate but there are transcription errors that can and very often do occur. I apologize for any typographical errors that were not detected and corrected.        Ursula Alert, MD 11/25/2020, 7:56 AM

## 2020-12-16 ENCOUNTER — Other Ambulatory Visit: Payer: Self-pay | Admitting: Internal Medicine

## 2020-12-16 DIAGNOSIS — Z1231 Encounter for screening mammogram for malignant neoplasm of breast: Secondary | ICD-10-CM

## 2021-01-10 ENCOUNTER — Ambulatory Visit
Admission: RE | Admit: 2021-01-10 | Discharge: 2021-01-10 | Disposition: A | Discharge status: Home / Self Care | Payer: Medicare Other | Source: Ambulatory Visit | Attending: Internal Medicine | Admitting: Internal Medicine

## 2021-01-10 ENCOUNTER — Other Ambulatory Visit: Payer: Self-pay

## 2021-01-10 DIAGNOSIS — Z1231 Encounter for screening mammogram for malignant neoplasm of breast: Secondary | ICD-10-CM | POA: Diagnosis present

## 2021-01-12 ENCOUNTER — Other Ambulatory Visit: Payer: Self-pay

## 2021-01-12 ENCOUNTER — Telehealth (INDEPENDENT_AMBULATORY_CARE_PROVIDER_SITE_OTHER): Payer: Medicare Other | Admitting: Psychiatry

## 2021-01-12 ENCOUNTER — Encounter: Payer: Self-pay | Admitting: *Deleted

## 2021-01-12 ENCOUNTER — Encounter: Payer: Self-pay | Admitting: Psychiatry

## 2021-01-12 DIAGNOSIS — F4010 Social phobia, unspecified: Secondary | ICD-10-CM | POA: Diagnosis not present

## 2021-01-12 DIAGNOSIS — F411 Generalized anxiety disorder: Secondary | ICD-10-CM | POA: Diagnosis not present

## 2021-01-12 DIAGNOSIS — F3176 Bipolar disorder, in full remission, most recent episode depressed: Secondary | ICD-10-CM | POA: Diagnosis not present

## 2021-01-12 NOTE — Progress Notes (Signed)
Virtual Visit via Video Note  I connected with Julie Harrison on 01/12/21 at 11:00 AM EDT by a video enabled telemedicine application and verified that I am speaking with the correct person using two identifiers.  Location Provider Location : [facility] Patient Location : Home  Participants: Patient , Provider    I discussed the limitations of evaluation and management by telemedicine and the availability of in person appointments. The patient expressed understanding and agreed to proceed.    I discussed the assessment and treatment plan with the patient. The patient was provided an opportunity to ask questions and all were answered. The patient agreed with the plan and demonstrated an understanding of the instructions.   The patient was advised to call back or seek an in-person evaluation if the symptoms worsen or if the condition fails to improve as anticipated.   [place] MD OP Progress Note  01/12/2021 7:23 PM Julie Harrison  MRN:  [id number]  Chief Complaint:  Chief Complaint    Follow-up; Depression     HPI: Julie Harrison is a 70 year old Caucasian female, engaged, retired, lives in [place], has a history of bipolar disorder, GAD, social anxiety disorder, hypothyroidism, hypertension was evaluated by telemedicine today.  Patient today reports overall she is doing well with regards to her mood.  She reports there are days when she feels down however she has been coping better with those kind of days.  She is able to count her blessings and feels as though she is more blessed than a lot of people around her.  She however does visit her grandchildren.  She reports she is planning to visit during the summer.  Patient reports sleep is overall okay.  Patient denies any suicidality, homicidality or perceptual disturbances.  Patient reports she had recent changes made with her  levothyroxine dosage since she had  TSH lab abnormality.  She continues to follow-up with her therapist and  reports therapy sessions are beneficial.  Patient denies any other concerns today.  Visit Diagnosis:    ICD-10-CM   1. Bipolar disorder, in full remission, most recent episode depressed ([place])  F31.76   2. GAD (generalized anxiety disorder)  F41.1   3. Social anxiety disorder  F40.10     Past Psychiatric History: I have reviewed past psychiatric history from my progress note on 03/04/2018.  Past trials of Viibryd, duloxetine, Pristiq, Zoloft, Lexapro, Latuda, Prozac, Brintellix  Past Medical History:  Past Medical History:  Diagnosis Date  . Hypertension   . 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 Personal history of colonic polyps   . Thyroid disease   . Tobacco dependence   . Vitamin D deficiency    History reviewed. No pertinent surgical history.  Family Psychiatric History: I have reviewed family psychiatric history from my progress note on 03/04/2018  Family History:  Family History  Problem Relation Age of Onset  . Depression Mother   . Alcohol abuse Father   . Alcohol abuse Paternal Uncle   . Depression Maternal Grandmother   . Breast cancer Maternal Grandmother 80  . Breast cancer Maternal Aunt 50    Social History: I have reviewed social history from my progress note on 03/04/2018 Social History   Socioeconomic History  . Marital status: Divorced    Spouse name: Not on file  . Number of children: 2  . Years of education: Not on file  . Highest education level: Associate degree: occupational, Hotel manager, or vocational  program  Occupational History    Comment: retired  Tobacco Use  . Smoking status: Former Smoker    Packs/day: 0.25    Types: Cigarettes    Quit date: 03/22/2018    Years since quitting: 2.8  . Smokeless tobacco: Never Used  Vaping Use  . Vaping Use: Former  Substance and Sexual Activity  . Alcohol use: Yes    Alcohol/week: 2.0 standard drinks    Types: 2 Glasses of wine per week  . Drug use:  Yes    Types: Marijuana    Comment: about a month ago  . Sexual activity: Yes  Other Topics Concern  . Not on file  Social History Narrative  . Not on file   Social Determinants of Health   Financial Resource Strain: Not on file  Food Insecurity: Not on file  Transportation Needs: Not on file  Physical Activity: Not on file  Stress: Not on file  Social Connections: Not on file    Allergies:  Allergies  Allergen Reactions  . Hydralazine Hcl Itching  . Erythromycin Nausea Only  . Hydralazine Itching  . Hydrochlorothiazide Itching  . Vortioxetine Nausea Only    Metabolic Disorder Labs: No results found for: HGBA1C, MPG No results found for: PROLACTIN No results found for: CHOL, TRIG, HDL, CHOLHDL, VLDL, LDLCALC No results found for: TSH  Therapeutic Level Labs: No results found for: LITHIUM No results found for: VALPROATE No components found for:  CBMZ  Current Medications: Current Outpatient Medications  Medication Sig Dispense Refill  . albuterol (VENTOLIN HFA) 108 (90 Base) MCG/ACT inhaler INHALE 1 2 PUFFS BY MOUTH EVERY 4 HOURS AS NEEDED FOR WHEEZING    . alendronate (FOSAMAX) 70 MG tablet Take by mouth.    Marland Kitchen atorvastatin (LIPITOR) 10 MG tablet Take 10 mg by mouth daily.    Marland Kitchen atorvastatin (LIPITOR) 10 MG tablet Take by mouth.    . baclofen (LIORESAL) 10 MG tablet TAKE 1/2 TABLET BY MOUTH 3 TIMES A DAY AS NEEDED FOR 30 DAYS. TAKE NIGHT AT FIRST TO MAKE SURE NO DROWSINESS    . buPROPion (WELLBUTRIN XL) 300 MG 24 hr tablet Take 1 tablet (300 mg total) by mouth daily. 90 tablet 1  . Calcium Carb-Cholecalciferol (OYSTER SHELL CALCIUM) 500-400 MG-UNIT TABS Take by mouth.    . Calcium Carbonate-Vitamin D (OYSTER SHELL CALCIUM 500 + D) 500-125 MG-UNIT TABS Take by mouth.    . cefdinir (OMNICEF) 300 MG capsule Take 300 mg by mouth every 12 (twelve) hours.    Marland Kitchen COVID-19 Specimen Collection KIT TEST AS DIRECTED TODAY    . FLOVENT HFA 110 MCG/ACT inhaler 1 puff 2 (two) times  daily.    Marland Kitchen FLUoxetine (PROZAC) 10 MG capsule Take 1 capsule (10 mg total) by mouth daily with breakfast. 90 capsule 1  . gabapentin (NEURONTIN) 100 MG capsule Take 100 mg by mouth 3 (three) times daily.    . hyoscyamine (LEVSIN SL) 0.125 MG SL tablet PLACE 1 TABLET UNDER THE TONGUE EVERY FOUR (4) HOURS AS NEEDED FOR CRAMPING OR DIARRHEA.    Marland Kitchen ibandronate (BONIVA) 150 MG tablet Take 150 mg by mouth every morning.    Marland Kitchen levothyroxine (SYNTHROID, LEVOTHROID) 75 MCG tablet TAKE 1 TABLET BY MOUTH EVERY DAY FOR LOW THYROID  2  . lisinopril (PRINIVIL,ZESTRIL) 5 MG tablet Take 5 mg by mouth daily. for high blood pressure  1  . lisinopril (ZESTRIL) 10 MG tablet Take 10 mg by mouth daily.    Marland Kitchen loratadine (CLARITIN)  10 MG tablet Take by mouth.    . montelukast (SINGULAIR) 10 MG tablet Take 10 mg by mouth daily.    . pantoprazole (PROTONIX) 40 MG tablet Take 40 mg by mouth daily.    . predniSONE (DELTASONE) 20 MG tablet Take 40 mg by mouth daily.    . traZODone (DESYREL) 50 MG tablet TAKE 1/2 TO 1 TABLET (25-50 MG TOTAL) BY MOUTH AT BEDTIME AS NEEDED FOR SLEEP. 90 tablet 1   No current facility-administered medications for this visit.     Musculoskeletal: Strength & Muscle Tone: UTA Gait & Station: UTA Patient leans: N/A  Psychiatric Specialty Exam: Review of Systems  Psychiatric/Behavioral: The patient is nervous/anxious.   All other systems reviewed and are negative.   There were no vitals taken for this visit.There is no height or weight on file to calculate BMI.  General Appearance: Casual  Eye Contact:  Fair  Speech:  Clear and Coherent  Volume:  Normal  Mood:  Anxious Coping well  Affect:  Congruent  Thought Process:  Goal Directed and Descriptions of Associations: Intact  Orientation:  Full (Time, Place, and Person)  Thought Content: Logical   Suicidal Thoughts:  No  Homicidal Thoughts:  No  Memory:  Immediate;   Fair Recent;   Fair Remote;   Fair  Judgement:  Fair  Insight:   Fair  Psychomotor Activity:  Normal  Concentration:  Concentration: Fair and Attention Span: Fair  Recall:  [employer] of Knowledge: Fair  Language: Fair  Akathisia:  No  Handed:  Right  AIMS (if indicated): UTA  Assets:  Communication Skills Desire for Improvement Housing Intimacy Social Support  ADL's:  Intact  Cognition: WNL  Sleep:  Fair   Screenings: PHQ2-9   Flowsheet Row Video Visit from 11/24/2020 in [facility]  PHQ-2 Total Score 0    Flowsheet Row Video Visit from 11/24/2020 in [facility] No Risk       Assessment and Plan: Genavieve Mangiapane is a 70 year old Caucasian female who has a history of bipolar disorder, GAD, hypothyroidism was evaluated by telemedicine today.  Patient with psychosocial stressors of recent death of her friend, current pandemic, is currently stable on medications and psychotherapy sessions are beneficial.  Plan as noted below.  Plan Bipolar disorder type II in remission Wellbutrin XL 300 mg p.o. daily Prozac 10 mg p.o. daily-reduced dosage   GAD-stable Continue CBT with Ms. Christina Hussami Prozac 10 mg p.o. daily  Social anxiety disorder-stable Continue CBT  Reviewed labs-T3-dated 12/22/2020-92-within normal limits, T4-1.19-slightly elevated, TSH-low at 0.324-.  Patient reports she had recent changes in her levothyroxine dosage.  She will continue to follow-up with her provider.  Follow-up in clinic in person in 3 months or sooner if needed.  This note was generated in part or whole with voice recognition software. Voice recognition is usually quite accurate but there are transcription errors that can and very often do occur. I apologize for any typographical errors that were not detected and corrected.      Ursula Alert, MD 01/12/2021, 7:23 PM

## 2021-01-13 ENCOUNTER — Inpatient Hospital Stay
Admission: RE | Admit: 2021-01-13 | Discharge: 2021-01-13 | Disposition: A | Discharge status: Home / Self Care | Payer: Self-pay | Source: Ambulatory Visit | Attending: *Deleted | Admitting: *Deleted

## 2021-01-13 ENCOUNTER — Other Ambulatory Visit: Payer: Self-pay | Admitting: *Deleted

## 2021-01-13 DIAGNOSIS — Z1231 Encounter for screening mammogram for malignant neoplasm of breast: Secondary | ICD-10-CM

## 2021-02-19 ENCOUNTER — Other Ambulatory Visit: Payer: Self-pay | Admitting: Psychiatry

## 2021-02-19 DIAGNOSIS — F411 Generalized anxiety disorder: Secondary | ICD-10-CM

## 2021-02-19 DIAGNOSIS — F4010 Social phobia, unspecified: Secondary | ICD-10-CM

## 2021-02-23 ENCOUNTER — Other Ambulatory Visit: Payer: Self-pay | Admitting: Psychiatry

## 2021-02-23 DIAGNOSIS — F3176 Bipolar disorder, in full remission, most recent episode depressed: Secondary | ICD-10-CM

## 2021-03-07 ENCOUNTER — Encounter: Payer: Self-pay | Admitting: Gastroenterology

## 2021-03-07 ENCOUNTER — Other Ambulatory Visit: Payer: Self-pay

## 2021-03-07 ENCOUNTER — Ambulatory Visit (INDEPENDENT_AMBULATORY_CARE_PROVIDER_SITE_OTHER): Payer: Medicare Other | Admitting: Gastroenterology

## 2021-03-07 VITALS — BP 161/76 | HR 67 | Temp 98.0°F | Ht 59.0 in | Wt 119.4 lb

## 2021-03-07 DIAGNOSIS — K529 Noninfective gastroenteritis and colitis, unspecified: Secondary | ICD-10-CM | POA: Diagnosis not present

## 2021-03-07 NOTE — Progress Notes (Signed)
Arlyss Repress, MD [address]  [address]  [place], [place] [address]  Main: [phone]  Fax: [phone]    Gastroenterology Consultation  Referring Provider:     Enid Baas, MD Primary Care Physician:  Enid Baas, MD Primary Gastroenterologist:  Dr. Arlyss Repress Reason for Consultation:     Abdominal cramps and diarrhea        HPI:   Julie Harrison is a 70 y.o. female referred by Dr. Enid Baas, MD  for consultation & management of abdominal cramps and diarrhea.  Patient has several years history of postprandial urgency with loose stools associated with abdominal cramps, bloating.  She was originally evaluated by Dr. Norma Fredrickson at [facility], underwent upper endoscopy as well as colonoscopy which were unremarkable except for mild erosive esophagitis.  Random colon biopsies were normal.  Patient felt like her symptoms were not addressed and she followed up at [facility].  She underwent sits marker study at [facility] which revealed moderate stool burden and retention of sitz markers.  Subsequently, she was recommended to undergo anorectal manometry and she did not pursue it at that time.  Patient does have history of anxiety and she notices anxiety triggers her GI symptoms.  She does drink sodas, sweet tea regularly as well as consumes red meat few times a week.  Her weight has been stable.  Her labs including CBC, CMP were unremarkable, normal HbA1c.  She does have history of hypothyroidism, currently on thyroid replacement therapy  Patient does not smoke or drink alcohol Ex tobacco use  NSAIDs: None  Antiplts/Anticoagulants/Anti thrombotics: None  GI Procedures:  -Colonoscopy:08/05/2017 - Normal random colon bx, TA polyp x 1, diverticulosis, internal hemorroids - EGD: 08/05/2017 - Gastritis, no h pylori, LA Grade A esophagitis  Sits marker study at [facility] 12/16/2019 FINDINGS:  Thirty-one Sitz markers are noted throughout the colon. The distal  most marker is  likely at the level of the distal sigmoid colon. The  bowel gas pattern is nonobstructive. There is a moderate amount of  stool throughout the colon. There are degenerative changes of the  visualized thoracolumbar spine and bilateral hips, left worse than  right.   Past Medical History:  Diagnosis Date  . Hypertension   . 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 Personal history of colonic polyps   . Thyroid disease   . Tobacco dependence   . Vitamin D deficiency     History reviewed. No pertinent surgical history.  Current Outpatient Medications:  .  albuterol (VENTOLIN HFA) 108 (90 Base) MCG/ACT inhaler, INHALE 1 2 PUFFS BY MOUTH EVERY 4 HOURS AS NEEDED FOR WHEEZING, Disp: , Rfl:  .  buPROPion (WELLBUTRIN XL) 300 MG 24 hr tablet, TAKE 1 TABLET BY MOUTH EVERY DAY, Disp: 90 tablet, Rfl: 1 .  Calcium Carb-Cholecalciferol (OYSTER SHELL CALCIUM) 500-400 MG-UNIT TABS, Take by mouth., Disp: , Rfl:  .  Cholecalciferol 10 MCG (400 UNIT) CHEW, Chew by mouth., Disp: , Rfl:  .  FLUoxetine (PROZAC) 10 MG capsule, TAKE 1 CAPSULE (10 MG TOTAL) BY MOUTH DAILY WITH BREAKFAST., Disp: 90 capsule, Rfl: 1 .  levothyroxine (SYNTHROID, LEVOTHROID) 75 MCG tablet, TAKE 1 TABLET BY MOUTH EVERY DAY FOR LOW THYROID, Disp: , Rfl: 2 .  lisinopril (ZESTRIL) 10 MG tablet, Take 10 mg by mouth daily., Disp: , Rfl:  .  loratadine (CLARITIN) 10 MG tablet, Take by mouth., Disp: , Rfl:  .  pantoprazole (PROTONIX) 40 MG  tablet, Take 40 mg by mouth daily., Disp: , Rfl:  .  rosuvastatin (CRESTOR) 10 MG tablet, Take 10 mg by mouth daily., Disp: , Rfl:  .  traZODone (DESYREL) 50 MG tablet, TAKE 1/2 TO 1 TABLET (25-50 MG TOTAL) BY MOUTH AT BEDTIME AS NEEDED FOR SLEEP., Disp: 90 tablet, Rfl: 1 .  alendronate (FOSAMAX) 70 MG tablet, Take by mouth. (Patient not taking: Reported on 03/07/2021), Disp: , Rfl:  .  ibandronate (BONIVA) 150 MG tablet, Take 150 mg by mouth every morning.  (Patient not taking: Reported on 03/07/2021), Disp: , Rfl:  .  lisinopril (PRINIVIL,ZESTRIL) 5 MG tablet, Take 5 mg by mouth daily. for high blood pressure (Patient not taking: Reported on 03/07/2021), Disp: , Rfl: 1   Family History  Problem Relation Age of Onset  . Depression Mother   . Alcohol abuse Father   . Alcohol abuse Paternal Uncle   . Depression Maternal Grandmother   . Breast cancer Maternal Grandmother 50  . Breast cancer Maternal Aunt 3     Social History   Tobacco Use  . Smoking status: Former Smoker    Packs/day: 0.25    Types: Cigarettes    Quit date: 03/22/2018    Years since quitting: 2.9  . Smokeless tobacco: Never Used  Vaping Use  . Vaping Use: Former  Substance Use Topics  . Alcohol use: Yes    Alcohol/week: 2.0 standard drinks    Types: 2 Glasses of wine per week  . Drug use: Yes    Types: Marijuana    Comment: about a month ago    Allergies as of 03/07/2021 - Review Complete 03/07/2021  Allergen Reaction Noted  . Hydralazine hcl Itching 07/12/2017  . Erythromycin Nausea Only 11/08/2016  . Hydralazine Itching 07/12/2017  . Hydrochlorothiazide Itching 11/08/2016  . Vortioxetine Nausea Only 11/08/2016    Review of Systems:    All systems reviewed and negative except where noted in HPI.   Physical Exam:  BP (!) 161/76 (BP Location: Left Arm, Patient Position: Sitting, Cuff Size: Normal)   Pulse 67   Temp 98 F (36.7 C) (Oral)   Ht 4\' 11"  (1.499 m)   Wt 119 lb 6 oz (54.1 kg)   BMI 24.11 kg/m  No LMP recorded. Patient is postmenopausal.  General:   Alert,  Well-developed, well-nourished, pleasant and cooperative in NAD Head:  Normocephalic and atraumatic. Eyes:  Sclera clear, no icterus.   Conjunctiva pink. Ears:  Normal auditory acuity. Nose:  No deformity, discharge, or lesions. Mouth:  No deformity or lesions,oropharynx pink & moist. Neck:  Supple; no masses or thyromegaly. Lungs:  Respirations even and unlabored.  Clear throughout to  auscultation.   No wheezes, crackles, or rhonchi. No acute distress. Heart:  Regular rate and rhythm; no murmurs, clicks, rubs, or gallops. Abdomen:  Normal bowel sounds. Soft, non-tender and mildly distended, tympanic to percussion without masses, hepatosplenomegaly or hernias noted.  No guarding or rebound tenderness.   Rectal: Not performed Msk:  Symmetrical without gross deformities. Good, equal movement & strength bilaterally. Pulses:  Normal pulses noted. Extremities:  No clubbing or edema.  No cyanosis. Neurologic:  Alert and oriented x3;  grossly normal neurologically. Skin:  Intact without significant lesions or rashes. No jaundice. Psych:  Alert and cooperative. Normal mood and affect.  Imaging Studies: Reviewed  Assessment and Plan:   Mario Voong is a 69 y.o. pleasant Caucasian female with history of anxiety, depression, history of hypothyroidism, hypertension, history of tobacco use  on Wellbutrin is seen in consultation for chronic symptoms of abdominal bloating, abdominal cramps as well as postprandial urgency, loose stools without rectal bleeding. EGD and colonoscopy were unremarkable, no evidence of H. pylori, random colon biopsies were normal  Patient is currently on hyoscyamine 0.125 MCG, advised her to take before each meal She can also take Imodium as needed Check pancreatic fecal elastase levels Strongly advised her to eliminate red meat, carbonated beverages and artificial sweeteners from her diet Advised and strict lactose-free diet   Follow up in 2 to 3 months   Arlyss Repress, MD

## 2021-03-07 NOTE — Patient Instructions (Signed)
Lactose Intolerance, Adult Lactose is a natural sugar that is found in dairy milk and dairy products such as cheese and yogurt. Lactose is digested by lactase, a protein (enzyme) in your small intestine. Some people do not produce enough lactase to digest lactose. This is called lactose intolerance. Lactose intolerance is different from milk allergy, which is a more serious reaction to the protein in milk. What are the causes? Causes of lactose intolerance may include:  Normal aging. The ability to produce lactase may lessen with age, causing lactose intolerance over time.  Being born without the ability to make lactase.  Digestive diseases such as gastroenteritis or inflammatory bowel disease (IBD).  Surgery or injury to your small intestine.  Infection in your intestines.  Certain antibiotic medicines and cancer treatments. What are the signs or symptoms? Lactose intolerance can cause discomfort within 30 minutes to 2 hours after you eat or drink something that contains lactose. Symptoms may include:  Nausea.  Diarrhea.  Cramps or pain in the abdomen.  A full, tight, or painful feeling in the abdomen (bloating).  Gas. How is this diagnosed? This condition may be diagnosed based on:  Your symptoms and medical history.  Lactose tolerance test. This test involves drinking a lactose solution and then having blood tests to measure the amount of glucose in your blood. If your blood glucose level does not go up, it means your body is not able to digest the lactose.  Lactose breath test (hydrogen breath test). This test involves drinking a lactose solution and then exhaling into a type of bag while you digest the solution. Having a lot of hydrogen in your breath can be a sign of lactose intolerance.  Stool acidity test. This involves drinking a lactose solution and then having your stool samples tested for bacteria. Having a lot of bacteria causes stool to be considered acidic, which  is a sign of lactose intolerance.   How is this treated? There is no treatment to improve your body's ability to produce lactase. However, you can manage your symptoms at home by:  Limiting or avoiding dairy milk, dairy products, and other sources of lactose.  Taking lactase tablets when you eat milk products. Lactase tablets are over-the-counter medicines that help to improve lactose digestion. You may also add lactase drops to regular milk.  Adjusting your diet, such as drinking lactose-free milk. Lactose tolerance varies from person to person. Some people may be able to eat or drink small amounts of products that contain lactose, and other people may need to avoid all foods and drinks that contain lactose. Talk with your health care provider about what treatment is best for you. Follow these instructions at home:  Limit or avoid foods, beverages, and medicines that contain lactose, as told by your health care provider. Keep track of which foods, beverages, or medicines cause symptoms so you can decide what to avoid in the future.  Read food and medicine labels carefully. Avoid products that contain: ? Lactose. ? Milk solids. ? Casein. ? Whey.  Take over-the-counter and prescription medicines (including lactase tablets) only as told by your health care provider.  If you stop eating and drinking dairy products (eliminate dairy from your diet), make sure to get enough protein, calcium, and vitamin D from other foods. Work with your health care provider or a diet and nutrition specialist (dietitian) to make sure you get enough of those nutrients.  Choose a milk substitute that is fortified with calcium and vitamin D. ?   Soy milk contains high-quality protein. ? Milks that are made from nuts or grains (such as almond milk and rice milk) contain very small amounts of protein.  Keep all follow-up visits as told by your health care provider. This is important. Contact a health care provider  if:  You have no relief from your symptoms after you have eliminated milk products and other sources of lactose. Get help right away if:  You have blood in your stool.  You have severe abdomen (abdominal) pain. Summary  Lactose is a natural sugar that is found in dairy milk and dairy products such as cheese and yogurt. Lactose is digested by lactase, which is a protein (enzyme) in the small intestine.  Some people do not produce enough lactase to digest lactose. This is called lactose intolerance.  Lactose intolerance can cause discomfort within 30 minutes to 2 hours after you eat or drink something that contains lactose.  Limit or avoid foods, beverages, and medicines that contain lactose, as told by your health care provider. This information is not intended to replace advice given to you by your health care provider. Make sure you discuss any questions you have with your health care provider. Document Revised: 09/13/2017 Document Reviewed: 05/17/2017 Elsevier Patient Education  2021 [employer]. Lactose-Free Diet, Adult If you have lactose intolerance, you are not able to digest lactose. Lactose is a natural sugar found mainly in dairy milk and dairy products. You may need to avoid all foods and beverages that contain lactose. A lactose-free diet can help you do this. Which foods have lactose? Lactose is found in dairy milk and dairy products, such as:  Yogurt.  Cheese.  Butter.  Margarine.  Sour cream.  Cream.  Whipped toppings and nondairy creamers.  Ice cream and other dairy-based desserts. Lactose is also found in foods or products made with dairy milk or milk ingredients. To find out whether a food contains dairy milk or a milk ingredient, look at the ingredients list. Avoid foods with the statement "May contain milk" and foods that contain:  Milk powder.  Whey.  Curd.  Caseinate.  Lactose.  Lactalbumin.  Lactoglobulin. What are alternatives to dairy  milk and foods made with milk products?  Lactose-free milk.  Soy milk with added calcium and vitamin D.  Almond milk, coconut milk, rice milk, or other nondairy milk alternatives with added calcium and vitamin D. Note that these are low in protein.  Soy products, such as soy yogurt, soy cheese, soy ice cream, and soy-based sour cream.  Other nut milk products, such as almond yogurt, almond cheese, cashew yogurt, cashew cheese, cashew ice cream, coconut yogurt, and coconut ice cream. What are tips for following this plan?  Do not consume foods, beverages, vitamins, minerals, or medicines containing lactose. Read ingredient lists carefully.  Look for the words "lactose-free" on labels.  Use lactase enzyme drops or tablets as directed by your health care provider.  Use lactose-free milk or a milk alternative, such as soy milk or almond milk, for drinking and cooking.  Make sure you get enough calcium and vitamin D in your diet. A lactose-free eating plan can be lacking in these important nutrients.  Take calcium and vitamin D supplements as directed by your health care provider. Talk to your health care provider about supplements if you are not able to get enough calcium and vitamin D from food. What foods can I eat? Fruits All fresh, canned, frozen, or dried fruits that are not processed with   lactose. Vegetables All fresh, frozen, and canned vegetables without cheese, cream, or butter sauces. Grains Any that are not made with dairy milk or dairy products. Meats and other proteins Any meat, fish, poultry, and other protein sources that are not made with dairy milk or dairy products. Soy cheese and yogurt. Fats and oils Any that are not made with dairy milk or dairy products. Beverages Lactose-free milk. Soy, rice, or almond milk with added calcium and vitamin D. Fruit and vegetable juices. Sweets and desserts Any that are not made with dairy milk or dairy products. Seasonings and  condiments Any that are not made with dairy milk or dairy products. Calcium Calcium is found in many foods that contain lactose and is important for bone health. The amount of calcium you need depends on your age:  Adults younger than 50 years: 1,000 mg of calcium a day.  Adults older than 50 years: 1,200 mg of calcium a day. If you are not getting enough calcium, you may get it from other sources, including:  Orange juice with calcium added. There are 300-350 mg of calcium in 1 cup of orange juice.  Calcium-fortified soy milk. There are 300-400 mg of calcium in 1 cup of calcium-fortified soy milk.  Calcium-fortified rice or almond milk. There are 300 mg of calcium in 1 cup of calcium-fortified rice or almond milk.  Calcium-fortified breakfast cereals. There are 100-1,000 mg of calcium in calcium-fortified breakfast cereals.  Spinach, cooked. There are 145 mg of calcium in  cup of cooked spinach.  Edamame, cooked. There are 130 mg of calcium in  cup of cooked edamame.  Collard greens, cooked. There are 125 mg of calcium in  cup of cooked collard greens.  Kale, frozen or cooked. There are 90 mg of calcium in  cup of cooked or frozen kale.  Almonds. There are 95 mg of calcium in  cup of almonds.  Broccoli, cooked. There are 60 mg of calcium in 1 cup of cooked broccoli. The items listed above may not be a complete list of recommended foods and beverages. Contact a dietitian for more options.   What foods are not recommended? Fruits None, unless they are made with dairy milk or dairy products. Vegetables None, unless they are made with dairy milk or dairy products. Grains Any grains that are made with dairy milk or dairy products. Meats and other proteins None, unless they are made with dairy milk or dairy products. Dairy All dairy products, including milk, goat's milk, buttermilk, kefir, acidophilus milk, flavored milk, evaporated milk, condensed milk, dulce de leche,  eggnog, yogurt, cheese, and cheese spreads. Fats and oils Any that are made with milk or milk products. Margarines and salad dressings that contain milk or cheese. Cream. Half and half. Cream cheese. Sour cream. Chip dips made with sour cream or yogurt. Beverages Hot chocolate. Cocoa with lactose. Instant iced teas. Powdered fruit drinks. Smoothies made with dairy milk or yogurt. Sweets and desserts Any that are made with milk or milk products. Seasonings and condiments Chewing gum that has lactose. Spice blends if they contain lactose. Artificial sweeteners that contain lactose. Nondairy creamers. The items listed above may not be a complete list of foods and beverages to avoid. Contact a dietitian for more information. Summary  If you are lactose intolerant, it means that you have a hard time digesting lactose, a natural sugar found in milk and milk products.  Following a lactose-free diet can help you manage this condition.  Calcium is   important for bone health and is found in many foods that contain lactose. Talk with your health care provider about other sources of calcium. This information is not intended to replace advice given to you by your health care provider. Make sure you discuss any questions you have with your health care provider. Document Revised: 10/29/2017 Document Reviewed: 10/29/2017 Elsevier Patient Education  2021 [employer] Inc.  

## 2021-03-22 ENCOUNTER — Ambulatory Visit: Payer: Medicare Other | Admitting: Licensed Clinical Social Worker

## 2021-03-23 ENCOUNTER — Ambulatory Visit
Admission: RE | Admit: 2021-03-23 | Discharge: 2021-03-23 | Disposition: A | Discharge status: Home / Self Care | Payer: Medicare Other | Source: Ambulatory Visit | Attending: Family Medicine | Admitting: Family Medicine

## 2021-03-23 ENCOUNTER — Other Ambulatory Visit: Payer: Self-pay

## 2021-03-23 VITALS — BP 150/82 | HR 79 | Temp 98.3°F | Resp 18

## 2021-03-23 DIAGNOSIS — J069 Acute upper respiratory infection, unspecified: Secondary | ICD-10-CM | POA: Diagnosis not present

## 2021-03-23 DIAGNOSIS — R509 Fever, unspecified: Secondary | ICD-10-CM

## 2021-03-23 LAB — POCT INFLUENZA A/B
Influenza A, POC: NEGATIVE
Influenza B, POC: NEGATIVE

## 2021-03-23 NOTE — ED Provider Notes (Signed)
[facility]    CSN: [id number] Arrival date & time: 03/23/21  7035      History   Chief Complaint Chief Complaint  Patient presents with   Fever   Hoarse    HPI Julie Harrison is a 70 y.o. female.   Patient presenting today with 4 to 5-day history of intermittent low-grade fevers, sore throat, congestion, hoarseness, postnasal drainage.  She states symptoms started shortly after attending her grandsons birthday party over the weekend.  Denies chest pain, shortness of breath, abdominal pain, nausea, vomiting, rashes.  So far taking over-the-counter pain relievers for fever reduction with good temporary relief.  Does feel like symptoms have improved over the past 24 hours but had a fever again last night so wanted to come in for a check.  Has not taken a home COVID test since onset.  Past medical history significant for hypertension, CAD, IBS, anxiety and depression.  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 [facility])    Personal history of colonic polyps    Thyroid disease    Tobacco dependence    Vitamin D deficiency    Patient Active Problem List   Diagnosis Date Noted   Bipolar disorder, in full remission, most recent episode depressed (HCC) 11/25/2019   Bipolar 2 disorder, major depressive episode (HCC) 06/23/2019   GAD (generalized anxiety disorder) 06/23/2019   Social anxiety disorder 06/23/2019   Hyperlipidemia, mixed 03/19/2019   Benign essential HTN 02/25/2019   Coronary artery disease involving native coronary artery of native heart 02/25/2019   SOBOE (shortness of breath on exertion) 02/25/2019   Bowel habit changes 11/21/2017   Generalized abdominal pain 11/21/2017   Hemorrhoid 07/09/2017   Osteoporosis 02/14/2017   Vitamin D deficiency 11/08/2016   Hypertension 11/08/2016    History reviewed. No pertinent surgical history.  OB History   No obstetric  history on file.     Home Medications    Prior to Admission medications   Medication Sig Start Date End Date Taking? Authorizing Provider  albuterol (VENTOLIN HFA) 108 (90 Base) MCG/ACT inhaler INHALE 1 2 PUFFS BY MOUTH EVERY 4 HOURS AS NEEDED FOR WHEEZING 10/22/19   [provider]  alendronate (FOSAMAX) 70 MG tablet Take by mouth. Patient not taking: Reported on 03/07/2021 07/02/17   [provider]  buPROPion (WELLBUTRIN XL) 300 MG 24 hr tablet TAKE 1 TABLET BY MOUTH EVERY DAY 02/23/21   Jomarie Longs, MD  Calcium Carb-Cholecalciferol (OYSTER SHELL CALCIUM) 500-400 MG-UNIT TABS Take by mouth.    [provider]  Cholecalciferol 10 MCG (400 UNIT) CHEW Chew by mouth.    [provider]  FLUoxetine (PROZAC) 10 MG capsule TAKE 1 CAPSULE (10 MG TOTAL) BY MOUTH DAILY WITH BREAKFAST. 02/20/21   Jomarie Longs, MD  ibandronate (BONIVA) 150 MG tablet Take 150 mg by mouth every morning. Patient not taking: Reported on 03/07/2021 07/31/19   [provider]  levothyroxine (SYNTHROID, LEVOTHROID) 75 MCG tablet TAKE 1 TABLET BY MOUTH EVERY DAY FOR LOW THYROID 07/09/18   [provider]  lisinopril (PRINIVIL,ZESTRIL) 5 MG tablet Take 5 mg by mouth daily. for high blood pressure Patient not taking: Reported on 03/07/2021 02/07/18   [provider]  lisinopril (ZESTRIL) 10 MG tablet Take 10 mg by mouth daily. 08/09/19   [provider]  loratadine (CLARITIN) 10 MG tablet Take by mouth.    [provider]  pantoprazole (PROTONIX) 40 MG tablet Take 40 mg by mouth daily. 02/03/19   [provider]  rosuvastatin (CRESTOR) 10 MG tablet Take 10 mg by mouth daily. 02/15/21   [provider]  traZODone (DESYREL) 50 MG tablet TAKE 1/2 TO 1 TABLET (25-50 MG TOTAL) BY MOUTH AT BEDTIME AS NEEDED FOR SLEEP. 11/07/18   Jomarie Longs, MD    Family History Family History  Problem Relation Age of Onset   Depression Mother     Alcohol abuse Father    Alcohol abuse Paternal Uncle    Depression Maternal Grandmother    Breast cancer Maternal Grandmother 45   Breast cancer Maternal Aunt 63   Social History Social History   Tobacco Use   Smoking status: Former    Packs/day: 0.25    Pack years: 0.00    Types: Cigarettes    Quit date: 03/22/2018    Years since quitting: 3.0   Smokeless tobacco: Never  Vaping Use   Vaping Use: Former  Substance Use Topics   Alcohol use: Yes    Alcohol/week: 2.0 standard drinks    Types: 2 Glasses of wine per week   Drug use: Yes    Types: Marijuana    Comment: about a month ago    Allergies   Hydralazine hcl, Erythromycin, Hydralazine, Hydrochlorothiazide, and Vortioxetine   Review of Systems Review of Systems PER HPI  Physical Exam Triage Vital Signs ED Triage Vitals  Enc Vitals Group     BP 03/23/21 1004 (!) 150/82     Pulse Rate 03/23/21 1004 79     Resp 03/23/21 1004 18     Temp 03/23/21 1004 98.3 F (36.8 C)     Temp Source 03/23/21 1004 Oral     SpO2 03/23/21 1004 95 %     Weight --      Height --      Head Circumference --      Peak Flow --      Pain Score 03/23/21 1008 0     Pain Loc --      Pain Edu? --      Excl. in GC? --    No data found.  Updated Vital Signs BP (!) 150/82 (BP Location: Right Arm)   Pulse 79   Temp 98.3 F (36.8 C) (Oral)   Resp 18   SpO2 95%   Visual Acuity Right Eye Distance:   Left Eye Distance:   Bilateral Distance:    Right Eye Near:   Left Eye Near:    Bilateral Near:     Physical Exam Vitals and nursing note reviewed.  Constitutional:      Appearance: Normal appearance. She is not ill-appearing.  HENT:     Head: Atraumatic.     Right Ear: Tympanic membrane normal.     Left Ear: Tympanic membrane normal.     Nose:     Comments: Bilateral nasal turbinates erythematous and edematous    Mouth/Throat:     Mouth: Mucous membranes are moist.     Pharynx: Oropharynx is clear.  Eyes:     Extraocular  Movements: Extraocular movements intact.     Conjunctiva/sclera: Conjunctivae normal.  Cardiovascular:     Rate and Rhythm: Normal rate and regular rhythm.     Heart sounds: Normal heart sounds.  Pulmonary:     Effort: Pulmonary effort is normal. No respiratory distress.     Breath sounds: Normal breath sounds. No wheezing or rales.  Abdominal:  General: Bowel sounds are normal. There is no distension.     Palpations: Abdomen is soft.     Tenderness: There is no abdominal tenderness. There is no guarding.  Musculoskeletal:        General: Normal range of motion.     Cervical back: Normal range of motion and neck supple.  Skin:    General: Skin is warm and dry.  Neurological:     Mental Status: She is alert and oriented to person, place, and time.  Psychiatric:        Mood and Affect: Mood normal.        Thought Content: Thought content normal.        Judgment: Judgment normal.   UC Treatments / Results  Labs (all labs ordered are listed, but only abnormal results are displayed) Labs Reviewed  NOVEL CORONAVIRUS, NAA  POCT INFLUENZA A/B   EKG  Radiology No results found.  Procedures Procedures (including critical care time)  Medications Ordered in UC Medications - No data to display  Initial Impression / Assessment and Plan / UC Course  I have reviewed the triage vital signs and the nursing notes.  Pertinent labs & imaging results that were available during my care of the patient were reviewed by me and considered in my medical decision making (see chart for details).     Same vital signs very reassuring today, rapid flu negative.  COVID PCR pending.  Does appear that symptoms are resolving well at this time, will continue to monitor, over-the-counter fever reducers and Coricidin HBP if needed for congestion.  Follow-up if acutely worsening or not resolving.  Final Clinical Impressions(s) / UC Diagnoses   Final diagnoses:  Viral URI  Fever, unspecified    Discharge Instructions   None    ED Prescriptions   None    PDMP not reviewed this encounter.   Particia Nearing, [place] 03/23/21 1126

## 2021-03-23 NOTE — ED Triage Notes (Signed)
Four day h/o subjective fever, sore throat, congestion, hoarse and post nasal drip after attending a kids birthday party. Denies nausea and vomiting. Confirms diarrhea, but feels that it is unrelated due to h/o IBS.  Last dose of Tylenol taken last night with minimal relief.

## 2021-03-24 LAB — NOVEL CORONAVIRUS, NAA: SARS-CoV-2, NAA: NOT DETECTED

## 2021-03-24 LAB — SARS-COV-2, NAA 2 DAY TAT

## 2021-04-04 ENCOUNTER — Ambulatory Visit: Payer: Medicare Other | Admitting: Psychiatry

## 2021-04-15 LAB — PANCREATIC ELASTASE, FECAL: Pancreatic Elastase, Fecal: 128 ug Elast./g — ABNORMAL LOW (ref >200)

## 2021-04-19 ENCOUNTER — Telehealth: Payer: Self-pay

## 2021-04-19 MED ORDER — ZENPEP 40000-126000 UNITS PO CPEP: ORAL_CAPSULE | ORAL | 1 refills | Status: DC

## 2021-04-19 NOTE — Telephone Encounter (Signed)
Called and left a message for call back. Sent Zenpep to the pharmacy

## 2021-04-19 NOTE — Telephone Encounter (Signed)
-----   Message from Toney Reil, MD sent at 04/19/2021  8:30 AM EDT ----- Stool studies confirm exocrine pancreatic insufficiency, recommend pancreatic enzymes based on her insurance, Creon 36K or Zenpep 40K 2 capsules with each meal and 1 with snack  Rohini Vanga

## 2021-04-20 NOTE — Telephone Encounter (Signed)
Called and left a message for call back. Sent mychart message  °

## 2021-05-01 ENCOUNTER — Other Ambulatory Visit: Payer: Self-pay

## 2021-05-01 ENCOUNTER — Ambulatory Visit (INDEPENDENT_AMBULATORY_CARE_PROVIDER_SITE_OTHER): Payer: Medicare Other | Admitting: Licensed Clinical Social Worker

## 2021-05-01 DIAGNOSIS — F411 Generalized anxiety disorder: Secondary | ICD-10-CM

## 2021-05-01 DIAGNOSIS — F3176 Bipolar disorder, in full remission, most recent episode depressed: Secondary | ICD-10-CM

## 2021-05-01 NOTE — Telephone Encounter (Signed)
Morrie Sheldon  Can we try Creon instead?  RV

## 2021-05-01 NOTE — Telephone Encounter (Signed)
Patient said in [place] message that she pick up the Zenpep. Did you still want to change to creon

## 2021-05-01 NOTE — Progress Notes (Signed)
Virtual Visit via Audio Note  I connected with Julie Harrison on 05/01/21 at 10:00 AM EDT by an audio enabled telemedicine application and verified that I am speaking with the correct person using two identifiers.  Location: Patient: home Provider: remote office [place], [place])   I discussed the limitations of evaluation and management by telemedicine and the availability of in person appointments. The patient expressed understanding and agreed to proceed.   I discussed the assessment and treatment plan with the patient. The patient was provided an opportunity to ask questions and all were answered. The patient agreed with the plan and demonstrated an understanding of the instructions.   The patient was advised to call back or seek an in-person evaluation if the symptoms worsen or if the condition fails to improve as anticipated.  I provided 60 minutes of non-face-to-face time during this encounter.   Patricio Popwell R Novaleigh Kohlman, LCSW   THERAPIST PROGRESS NOTE  Session Time: 9-10a  Participation Level: Active  Behavioral Response: NAAlertAnxious  Type of Therapy: Individual Therapy  Treatment Goals addressed: Anxiety and Coping  Interventions: Psychosocial Skills: communication skills and Family Systems  Summary: Julie Harrison is a 70 y.o. female who presents with continuing symptoms related to anxiety diagnosis. Patient reports that overall mood is stable, in that she is getting good quality and quantity of sleep. Patient reports that she is compliant with medication.   Allowed patient safe space to explore and express thoughts and feelings associated with recent family conflict, and the anxiety that this has triggered. Discussed pts two grandsons that are the sons of her late daughter. Patient has a complicated relationship with son-in-law, and his partner. Discussed relationship issues and initial conflict that triggered ongoing conflict between family members.   Discussed the  summer visit-grandsons are coming for three weeks in August. patient reports that she wants help figuring out how to move forward with her son-in-law, and other family members that are involved in this conflict period brainstormed through several different ways of communicating that will help patient express her thoughts and feelings and not trigger defensiveness with other family members.  Continued recommendations are as follows: self care behaviors, positive social engagements, focusing on overall work/home/life balance, and focusing on positive physical and emotional wellness.  .   Suicidal/Homicidal: No  Therapist Response: Patient continues to work hard to stabilize overall anxiety levels while increasing her ability to function on a daily basis. Patient is continuing to develop behavioral and cognitive strategies to reduce or eliminate anxiety. Patient is working hard to develop the ability to recognize, accept, and cope with feelings of depression. Patient is able to discuss and think about traumatic incidents without emotional turmoil. 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 These behaviors are reflective of overall personal growth and progress. Treatment to continue as indicated.  Plan: Return again in 4 weeks.  Diagnosis: Axis I: Bipolar, Depressed and Generalized Anxiety Disorder    Axis II: No diagnosis    [name] Ranell Finelli, LCSW 05/01/2021

## 2021-05-01 NOTE — Telephone Encounter (Signed)
She was just ranting about how much the medication cost her insurance company. She stated insurance covered the medication.  She stated: I have considered not taking this medication because of the cost, but not sure if there are any alternatives. I haven't started taking it yet, although I brought it home from the pharmacy.  It just really goes against my grain knowing that money should be spent more wisely....and that the taxpayers are getting screwed.

## 2021-05-03 ENCOUNTER — Telehealth (INDEPENDENT_AMBULATORY_CARE_PROVIDER_SITE_OTHER): Payer: Medicare Other | Admitting: Psychiatry

## 2021-05-03 ENCOUNTER — Encounter: Payer: Self-pay | Admitting: Psychiatry

## 2021-05-03 ENCOUNTER — Other Ambulatory Visit: Payer: Self-pay

## 2021-05-03 DIAGNOSIS — G4700 Insomnia, unspecified: Secondary | ICD-10-CM

## 2021-05-03 DIAGNOSIS — F3176 Bipolar disorder, in full remission, most recent episode depressed: Secondary | ICD-10-CM | POA: Diagnosis not present

## 2021-05-03 DIAGNOSIS — F4010 Social phobia, unspecified: Secondary | ICD-10-CM

## 2021-05-03 DIAGNOSIS — F411 Generalized anxiety disorder: Secondary | ICD-10-CM | POA: Diagnosis not present

## 2021-05-03 NOTE — Patient Instructions (Signed)
Insomnia Insomnia is a sleep disorder that makes it difficult to fall asleep or stay asleep. Insomnia can cause fatigue, low energy, difficulty concentrating, moodswings, and poor performance at work or school. There are three different ways to classify insomnia: Difficulty falling asleep. Difficulty staying asleep. Waking up too early in the morning. Any type of insomnia can be long-term (chronic) or short-term (acute). Both are common. Short-term insomnia usually lasts for three months or less. Chronic insomnia occurs at least three times a week for longer than threemonths. What are the causes? Insomnia may be caused by another condition, situation, or substance, such as: Anxiety. Certain medicines. Gastroesophageal reflux disease (GERD) or other gastrointestinal conditions. Asthma or other breathing conditions. Restless legs syndrome, sleep apnea, or other sleep disorders. Chronic pain. Menopause. Stroke. Abuse of alcohol, tobacco, or illegal drugs. Mental health conditions, such as depression. Caffeine. Neurological disorders, such as Alzheimer's disease. An overactive thyroid (hyperthyroidism). Sometimes, the cause of insomnia may not be known. What increases the risk? Risk factors for insomnia include: Gender. Women are affected more often than men. Age. Insomnia is more common as you get older. Stress. Lack of exercise. Irregular work schedule or working night shifts. Traveling between different time zones. Certain medical and mental health conditions. What are the signs or symptoms? If you have insomnia, the main symptom is having trouble falling asleep or having trouble staying asleep. This may lead to other symptoms, such as: Feeling fatigued or having low energy. Feeling nervous about going to sleep. Not feeling rested in the morning. Having trouble concentrating. Feeling irritable, anxious, or depressed. How is this diagnosed? This condition may be diagnosed based  on: Your symptoms and medical history. Your health care provider may ask about: Your sleep habits. Any medical conditions you have. Your mental health. A physical exam. How is this treated? Treatment for insomnia depends on the cause. Treatment may focus on treating an underlying condition that is causing insomnia. Treatment may also include: Medicines to help you sleep. Counseling or therapy. Lifestyle adjustments to help you sleep better. Follow these instructions at home: Eating and drinking  Limit or avoid alcohol, caffeinated beverages, and cigarettes, especially close to bedtime. These can disrupt your sleep. Do not eat a large meal or eat spicy foods right before bedtime. This can lead to digestive discomfort that can make it hard for you to sleep.  Sleep habits  Keep a sleep diary to help you and your health care provider figure out what could be causing your insomnia. Write down: When you sleep. When you wake up during the night. How well you sleep. How rested you feel the next day. Any side effects of medicines you are taking. What you eat and drink. Make your bedroom a dark, comfortable place where it is easy to fall asleep. Put up shades or blackout curtains to block light from outside. Use a white noise machine to block noise. Keep the temperature cool. Limit screen use before bedtime. This includes: Watching TV. Using your smartphone, tablet, or computer. Stick to a routine that includes going to bed and waking up at the same times every day and night. This can help you fall asleep faster. Consider making a quiet activity, such as reading, part of your nighttime routine. Try to avoid taking naps during the day so that you sleep better at night. Get out of bed if you are still awake after 15 minutes of trying to sleep. Keep the lights down, but try reading or doing a quiet   activity. When you feel sleepy, go back to bed.  General instructions Take over-the-counter  and prescription medicines only as told by your health care provider. Exercise regularly, as told by your health care provider. Avoid exercise starting several hours before bedtime. Use relaxation techniques to manage stress. Ask your health care provider to suggest some techniques that may work well for you. These may include: Breathing exercises. Routines to release muscle tension. Visualizing peaceful scenes. Make sure that you drive carefully. Avoid driving if you feel very sleepy. Keep all follow-up visits as told by your health care provider. This is important. Contact a health care provider if: You are tired throughout the day. You have trouble in your daily routine due to sleepiness. You continue to have sleep problems, or your sleep problems get worse. Get help right away if: You have serious thoughts about hurting yourself or someone else. If you ever feel like you may hurt yourself or others, or have thoughts about taking your own life, get help right away. You can go to your nearest emergency department or call: Your local emergency services (911 in the [place] A suicide crisis helpline, such as the National Suicide Prevention Lifeline at 1-800-273-8255. This is open 24 hours a day. Summary Insomnia is a sleep disorder that makes it difficult to fall asleep or stay asleep. Insomnia can be long-term (chronic) or short-term (acute). Treatment for insomnia depends on the cause. Treatment may focus on treating an underlying condition that is causing insomnia. Keep a sleep diary to help you and your health care provider figure out what could be causing your insomnia. This information is not intended to replace advice given to you by your health care provider. Make sure you discuss any questions you have with your healthcare provider. Document Revised: 08/11/2020 Document Reviewed: 08/11/2020 [employer] Patient Education  2022 [employer]  

## 2021-05-03 NOTE — Progress Notes (Signed)
[facility] virtual Visit via Video Note  I connected with Shenica Holzheimer on 05/03/21 at  9:40 AM EDT by a video enabled telemedicine application and verified that I am speaking with the correct person using two identifiers.  Location Provider Location : [facility] Patient Location : Home  Participants: Patient , Provider   I discussed the limitations of evaluation and management by telemedicine and the availability of in person appointments. The patient expressed understanding and agreed to proceed.   I discussed the assessment and treatment plan with the patient. The patient was provided an opportunity to ask questions and all were answered. The patient agreed with the plan and demonstrated an understanding of the instructions.   The patient was advised to call back or seek an in-person evaluation if the symptoms worsen or if the condition fails to improve as anticipated. [facility] MD OP Progress Note  05/03/2021 9:50 AM Shavanna Furnari  MRN:  [id number]  Chief Complaint:  Chief Complaint   Follow-up; Anxiety; Depression    HPI: Katreena Schupp is a 70 year old Caucasian female, engaged, retired, lives in [place], has a history of bipolar disorder, GAD, social anxiety disorder, hypothyroidism, hypertension was evaluated by telemedicine today.  Patient today reports she is currently having an IBS flareup.  She also was recently diagnosed with pancreatic insufficiency and is currently on Creon.  She does have anxiety about her medical problems however has been coping okay.  She is compliant on the medications which are beneficial.  She also had an appointment with her therapist and is motivated to stay in therapy.  Patient reports recent sleep problems.  She reports she has been waking up in the middle of the night to urinate.  She also has been drinking a lot of sweetened tea which could be contributing to this sleep issues.  She does have trazodone available however has not been taking  it.  Patient looks forward to her grandchildren coming in to visit during the summer.  Patient denies any suicidality, homicidality or perceptual disturbances.  Patient denies any other concerns today.  Visit Diagnosis:    ICD-10-CM   1. Bipolar disorder, in full remission, most recent episode depressed (HCC)  F31.76     2. GAD (generalized anxiety disorder)  F41.1     3. Social anxiety disorder  F40.10     4. Insomnia, unspecified type  G47.00       Past Psychiatric History: I have reviewed past psychiatric history from progress note on 03/04/2018.  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 No pertinent surgical history.  Family Psychiatric History: Reviewed family psychiatric history from progress note on 03/04/2018  Family History:  Family History  Problem Relation Age of Onset   Depression Mother    Alcohol abuse Father    Alcohol abuse Paternal Uncle    Depression Maternal Grandmother    Breast cancer Maternal Grandmother 26   Breast cancer Maternal Aunt 39    Social History: Reviewed social history from progress note on 03/04/2018 Social History   Socioeconomic History   Marital status: Divorced    Spouse name: Not on file  Number of children: 2   Years of education: Not on file   Highest education level: Associate degree: occupational, technical, or vocational program  Occupational History    Comment: retired  Tobacco Use   Smoking status: Former    Packs/day: 0.25    Types: Cigarettes    Quit date: 03/22/2018    Years since quitting: 3.1   Smokeless tobacco: Never  Vaping Use   Vaping Use: Former  Substance and  Sexual Activity   Alcohol use: Yes    Alcohol/week: 2.0 standard drinks    Types: 2 Glasses of wine per week   Drug use: Yes    Types: Marijuana    Comment: about a month ago   Sexual activity: Yes  Other Topics Concern   Not on file  Social History Narrative   Not on file   Social Determinants of Health   Financial Resource Strain: Not on file  Food Insecurity: Not on file  Transportation Needs: Not on file  Physical Activity: Not on file  Stress: Not on file  Social Connections: Not on file    Allergies:  Allergies  Allergen Reactions   Hydralazine Hcl Itching   Erythromycin Nausea Only   Hydralazine Itching   Hydrochlorothiazide Itching   Vortioxetine Nausea Only    Metabolic Disorder Labs: No results found for: HGBA1C, MPG No results found for: PROLACTIN No results found for: CHOL, TRIG, HDL, CHOLHDL, VLDL, LDLCALC No results found for: TSH  Therapeutic Level Labs: No results found for: LITHIUM No results found for: VALPROATE No components found for:  CBMZ  Current Medications: Current Outpatient Medications  Medication Sig Dispense Refill   levothyroxine (SYNTHROID) 25 MCG tablet Take by mouth.     albuterol (VENTOLIN HFA) 108 (90 Base) MCG/ACT inhaler INHALE 1 2 PUFFS BY MOUTH EVERY 4 HOURS AS NEEDED FOR WHEEZING     alendronate (FOSAMAX) 70 MG tablet Take by mouth. (Patient not taking: Reported on 03/07/2021)     buPROPion (WELLBUTRIN XL) 300 MG 24 hr tablet TAKE 1 TABLET BY MOUTH EVERY DAY 90 tablet 1   Calcium Carb-Cholecalciferol (OYSTER SHELL CALCIUM) 500-400 MG-UNIT TABS Take by mouth.     Cholecalciferol 10 MCG (400 UNIT) CHEW Chew by mouth.     FLUoxetine (PROZAC) 10 MG capsule TAKE 1 CAPSULE (10 MG TOTAL) BY MOUTH DAILY WITH BREAKFAST. 90 capsule 1   ibandronate (BONIVA) 150 MG tablet Take 150 mg by mouth every morning. (Patient not taking: Reported on 03/07/2021)     levothyroxine (SYNTHROID, LEVOTHROID) 75 MCG tablet TAKE 1 TABLET BY MOUTH EVERY  DAY FOR LOW THYROID  2   lisinopril (PRINIVIL,ZESTRIL) 5 MG tablet Take 5 mg by mouth daily. for high blood pressure (Patient not taking: Reported on 03/07/2021)  1   lisinopril (ZESTRIL) 10 MG tablet Take 10 mg by mouth daily.     loratadine (CLARITIN) 10 MG tablet Take by mouth.     Multiple Vitamin (MULTI-VITAMIN) tablet Take 1 tablet by mouth daily.     Omega-3 Fatty Acids (FISH OIL) 1000 MG CAPS Take by mouth.     Pancrelipase, Lip-Prot-Amyl, (ZENPEP) 40000-126000 units CPEP Take 2 capsules with the first bite of each meal and 1 capsule with the first bite of each snack 240 capsule 1   pantoprazole (PROTONIX) 40 MG tablet Take 40 mg by mouth daily.     rosuvastatin (CRESTOR) 10 MG tablet Take 10 mg by mouth daily.     traZODone (DESYREL) 50 MG tablet TAKE  1/2 TO 1 TABLET (25-50 MG TOTAL) BY MOUTH AT BEDTIME AS NEEDED FOR SLEEP. 90 tablet 1   No current facility-administered medications for this visit.     Musculoskeletal: Strength & Muscle Tone:  UTA Gait & Station: normal Patient leans: N/A  Psychiatric Specialty Exam: Review of Systems  Gastrointestinal:  Positive for diarrhea.  Psychiatric/Behavioral:  Positive for sleep disturbance. The patient is nervous/anxious.   All other systems reviewed and are negative.  There were no vitals taken for this visit.There is no height or weight on file to calculate BMI.  General Appearance: Casual  Eye Contact:  Fair  Speech:  Clear and Coherent  Volume:  Normal  Mood:  Anxious Coping well  Affect:  Congruent  Thought Process:  Goal Directed and Descriptions of Associations: Intact  Orientation:  Full (Time, Place, and Person)  Thought Content: Logical   Suicidal Thoughts:  No  Homicidal Thoughts:  No  Memory:  Immediate;   Fair Recent;   Fair Remote;   Fair  Judgement:  Fair  Insight:  Good  Psychomotor Activity:  Normal  Concentration:  Concentration: Good and Attention Span: Good  Recall:  [employer] of Knowledge: Fair   Language: Fair  Akathisia:  No  Handed:  Right  AIMS (if indicated): not done  Assets:  Communication Skills Desire for Improvement Housing Social Support Talents/Skills Transportation  ADL's:  Intact  Cognition: WNL  Sleep:  Poor   Screenings: PHQ2-9    Flowsheet Row Video Visit from 11/24/2020 in [facility]  PHQ-2 Total Score 0      Flowsheet Row ED from 03/23/2021 in [facility]  Video Visit from 11/24/2020 in [facility]  C-SSRS RISK CATEGORY No Risk No Risk        Assessment and Plan: Exa Bomba is a 70 year old Caucasian female, has a history of bipolar disorder, GAD, hypothyroidism was evaluated by telemedicine today.  Patient is currently having sleep problems.  Plan Bipolar disorder in remission Wellbutrin XL 300 mg p.o. daily Prozac 10 mg p.o. daily this dosage  GAD-stable Continue CBT with Ms. Christina Hussami Prozac 10 mg p.o. daily  Social anxiety disorder-stable Continue CBT   Insomnia -unstable Discussed sleep hygiene techniques like reducing the amount of caffeine, limiting fluid intake at the end of the day, limiting her bedtime, having a good bedtime and a wake-up time. Patient also advised to start taking trazodone 25 to 50 mg at bedtime as needed.  She has been noncompliant  Follow-up in clinic in 2 months in office.  This note was generated in part or whole with voice recognition software. Voice recognition is usually quite accurate but there are transcription errors that can and very often do occur. I apologize for any typographical errors that were not detected and corrected.     Jomarie Longs, MD 05/04/2021, 8:25 AM

## 2021-05-04 ENCOUNTER — Telehealth: Payer: Self-pay | Admitting: Gastroenterology

## 2021-05-04 NOTE — Telephone Encounter (Signed)
Called and left a message for call back  

## 2021-05-04 NOTE — Telephone Encounter (Signed)
Patient wants to discuss getting a refill on (ZENPEP) 40000-126000 units CPEP. Clinical staff will follow up with patient.

## 2021-05-04 NOTE — Telephone Encounter (Signed)
Patient returned Ashley's Phone call and wants to know if she could call back.

## 2021-05-05 NOTE — Telephone Encounter (Signed)
Called and left a message for call back  

## 2021-05-05 NOTE — Telephone Encounter (Signed)
Patient states she has not started the medication because she has a lot of questions. She will send a mychart message with the questions

## 2021-05-08 ENCOUNTER — Encounter: Payer: Self-pay | Admitting: Gastroenterology

## 2021-05-09 NOTE — Telephone Encounter (Signed)
Called and left a message for call back  

## 2021-05-10 ENCOUNTER — Telehealth (INDEPENDENT_AMBULATORY_CARE_PROVIDER_SITE_OTHER): Payer: Medicare Other | Admitting: Gastroenterology

## 2021-05-10 ENCOUNTER — Telehealth: Payer: Self-pay

## 2021-05-10 ENCOUNTER — Encounter: Payer: Self-pay | Admitting: Gastroenterology

## 2021-05-10 DIAGNOSIS — K8681 Exocrine pancreatic insufficiency: Secondary | ICD-10-CM

## 2021-05-10 NOTE — Addendum Note (Signed)
Addended by: Radene Knee L on: 05/10/2021 10:24 AM   Modules accepted: Orders

## 2021-05-10 NOTE — Progress Notes (Signed)
Lannette Donath, MD [address]  [address]  [place], [place] [address]  Main: [phone]  Fax: [phone]    Gastroenterology Consultation Video Visit  Referring Provider:     Enid Baas, MD Primary Care Physician:  Enid Baas, MD Primary Gastroenterologist:  Dr. Arlyss Repress Reason for Consultation:    Exocrine pancreatic insufficiency        HPI:   Julie Harrison is a 70 y.o. female referred by Dr. Enid Baas, MD  for consultation & management of chronic diarrhea and abdominal cramps with new diagnosis of EPI  Virtual Visit Video Note  I connected with Julie Harrison on 05/10/21 at  9:30 AM EDT by video and verified that I am speaking with the correct person using two identifiers.   I discussed the limitations, risks, security and privacy concerns of performing an evaluation and management service by video and the availability of in person appointments. I also discussed with the patient that there may be a patient responsible charge related to this service. The patient expressed understanding and agreed to proceed.  Location of the Patient: Home  Location of the provider: Home office  Persons participating in the visit: Patient and provider only   History of Present Illness:  I saw Julie Harrison end of May for chronic diarrhea and cramps after she underwent work-up by Dr. Norma Fredrickson.  I checked her pancreatic fecal elastase levels which confirmed moderate pancreatic insufficiency.  Her levels were 128.  I recommended Zenpep 40 K 2 capsules with each meal and 1 with snack.  Patient had several questions regarding the new diagnosis and therefore we have arranged MyChart video visit. Patient has not started Zenpep yet.  NSAIDs: none  Antiplts/Anticoagulants/Anti thrombotics: none  GI Procedures:  -Colonoscopy:08/05/2017 - Normal random colon bx, TA polyp x 1, diverticulosis, internal hemorroids - EGD: 08/05/2017 - Gastritis, no h pylori,  LA Grade A esophagitis   Sits marker study at [facility] 12/16/2019 FINDINGS:  Thirty-one Sitz markers are noted throughout the colon. The distal  most marker is likely at the level of the distal sigmoid colon. The  bowel gas pattern is nonobstructive. There is a moderate amount of  stool throughout the colon. There are degenerative changes of the  visualized thoracolumbar spine and bilateral hips, left worse than  right.    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  Current Outpatient Medications:    albuterol (VENTOLIN HFA) 108 (90 Base) MCG/ACT inhaler, INHALE 1 2 PUFFS BY MOUTH EVERY 4 HOURS AS NEEDED FOR WHEEZING, Disp: , Rfl:    buPROPion (WELLBUTRIN XL) 300 MG 24 hr tablet, TAKE 1 TABLET BY MOUTH EVERY DAY, Disp: 90 tablet, Rfl: 1   Calcium Carb-Cholecalciferol (OYSTER SHELL CALCIUM) 500-400 MG-UNIT TABS, Take by mouth., Disp: , Rfl:    Cholecalciferol 10 MCG (400 UNIT) CHEW, Chew by mouth., Disp: , Rfl:    FLUoxetine (PROZAC) 10 MG capsule, TAKE 1 CAPSULE (10 MG TOTAL) BY MOUTH DAILY WITH BREAKFAST., Disp: 90 capsule, Rfl: 1   levothyroxine (SYNTHROID) 25 MCG tablet, Take by mouth., Disp: , Rfl:    levothyroxine (SYNTHROID, LEVOTHROID) 75 MCG tablet, TAKE 1 TABLET BY MOUTH EVERY DAY FOR LOW  THYROID, Disp: , Rfl: 2   lisinopril (ZESTRIL) 10 MG tablet, Take 10 mg by mouth daily., Disp: , Rfl:    loratadine (CLARITIN) 10 MG tablet, Take by mouth., Disp: , Rfl:    Multiple Vitamin (MULTI-VITAMIN) tablet, Take 1 tablet by mouth daily., Disp: , Rfl:    Omega-3 Fatty Acids (FISH OIL) 1000 MG CAPS, Take by mouth., Disp: , Rfl:    pantoprazole (PROTONIX) 40 MG tablet, Take 40 mg by mouth daily., Disp: , Rfl:    rosuvastatin (CRESTOR) 10 MG tablet, Take  10 mg by mouth daily., Disp: , Rfl:    traZODone (DESYREL) 50 MG tablet, TAKE 1/2 TO 1 TABLET (25-50 MG TOTAL) BY MOUTH AT BEDTIME AS NEEDED FOR SLEEP., Disp: 90 tablet, Rfl: 1   alendronate (FOSAMAX) 70 MG tablet, Take by mouth. (Patient not taking: No sig reported), Disp: , Rfl:    ibandronate (BONIVA) 150 MG tablet, Take 150 mg by mouth every morning. (Patient not taking: No sig reported), Disp: , Rfl:    lisinopril (PRINIVIL,ZESTRIL) 5 MG tablet, Take 5 mg by mouth daily. for high blood pressure (Patient not taking: No sig reported), Disp: , Rfl: 1   Pancrelipase, Lip-Prot-Amyl, (ZENPEP) 40000-126000 units CPEP, Take 2 capsules with the first bite of each meal and 1 capsule with the first bite of each snack (Patient not taking: Reported on 05/10/2021), Disp: 240 capsule, Rfl: 1    Family History  Problem Relation Age of Onset   Depression Mother    Alcohol abuse Father    Alcohol abuse Paternal Uncle    Depression Maternal Grandmother    Breast cancer Maternal Grandmother 73   Breast cancer Maternal Aunt 10     Social History   Tobacco Use   Smoking status: Former    Packs/day: 0.25    Types: Cigarettes    Quit date: 03/22/2018    Years since quitting: 3.1   Smokeless tobacco: Never  Vaping Use   Vaping Use: Former  Substance Use Topics   Alcohol use: Yes    Alcohol/week: 2.0 standard drinks    Types: 2 Glasses of wine per week   Drug use: Yes    Types: Marijuana    Comment: about a month ago    Allergies as of 05/10/2021 - Review Complete 05/10/2021  Allergen Reaction Noted   Hydralazine hcl Itching 07/12/2017   Erythromycin Nausea Only 11/08/2016   Hydralazine Itching 07/12/2017   Hydrochlorothiazide Itching 11/08/2016   Vortioxetine Nausea Only 11/08/2016     Imaging Studies: No abdominal imaging  Assessment and Plan:   Julie Harrison is a 70 y.o. 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  This is permanent condition she needs to be on pancreatic enzyme replacement indefinitely.  She may have underlying chronic pancreatitis with her history of chronic tobacco use that has led to EPI.  She has stopped smoking.  I also recommend CT pancreas protocol to rule out pancreatic cancer or any other pancreatic lesions with new diagnosis of EPI Discussed about high-protein, low-fat, low-carb diet.  Her vitamin D levels are normal Advised patient to start Zenpep 40 K one capsule with each meal and 1 with snack and if she is still having diarrhea, increase to 2 capsules  Patient expressed understanding of the her new diagnosis and felt  reassured that all her questions were answered  Follow Up Instructions:   I discussed the assessment and treatment plan with the patient. The patient was provided an opportunity to ask questions and all were answered. The patient agreed with the plan and demonstrated an understanding of the instructions.   The patient was advised to call back or seek an in-person evaluation if the symptoms worsen or if the condition fails to improve as anticipated.  I provided 20 minutes of face-to-face time during this encounter.   Follow up as scheduled   Arlyss Repress, MD

## 2021-05-10 NOTE — Telephone Encounter (Signed)
Faxed referral for with CT Scan to [facility] in [place].  Called and got the fax number which was [phone].They said they will call and let patient know when the CT scan is schedule. Called patient and left a detaile message and sent a mychart message

## 2021-05-28 ENCOUNTER — Other Ambulatory Visit: Payer: Self-pay | Admitting: Gastroenterology

## 2021-06-07 ENCOUNTER — Ambulatory Visit: Payer: Medicare Other | Admitting: Gastroenterology

## 2021-06-14 ENCOUNTER — Ambulatory Visit: Payer: Medicare Other | Admitting: Licensed Clinical Social Worker

## 2021-06-20 ENCOUNTER — Encounter: Payer: Self-pay | Admitting: Gastroenterology

## 2021-06-20 ENCOUNTER — Other Ambulatory Visit: Payer: Self-pay

## 2021-06-20 ENCOUNTER — Encounter (INDEPENDENT_AMBULATORY_CARE_PROVIDER_SITE_OTHER): Payer: Medicare Other | Admitting: Vascular Surgery

## 2021-06-20 ENCOUNTER — Ambulatory Visit (INDEPENDENT_AMBULATORY_CARE_PROVIDER_SITE_OTHER): Payer: Medicare Other | Admitting: Gastroenterology

## 2021-06-20 ENCOUNTER — Encounter (INDEPENDENT_AMBULATORY_CARE_PROVIDER_SITE_OTHER): Payer: Self-pay | Admitting: Vascular Surgery

## 2021-06-20 VITALS — BP 175/89 | HR 86 | Temp 98.6°F | Ht 59.0 in | Wt 119.0 lb

## 2021-06-20 DIAGNOSIS — K8681 Exocrine pancreatic insufficiency: Secondary | ICD-10-CM

## 2021-06-20 NOTE — Progress Notes (Signed)
Arlyss Repress, MD [address]  [address]  [place], [place] [address]  Main: [phone]  Fax: [phone]    Gastroenterology Consultation  Referring Provider:     Enid Baas, MD Primary Care Physician:  Enid Baas, MD Primary Gastroenterologist:  Dr. Arlyss Repress Reason for Consultation:    Exocrine pancreatic insufficiency       HPI:   Julie Harrison is a 70 y.o. female referred by Dr. Enid Baas, MD  for consultation & management of abdominal cramps and diarrhea.  Patient has several years history of postprandial urgency with loose stools associated with abdominal cramps, bloating.  She was originally evaluated by Dr. Norma Fredrickson at [place] [facility], underwent upper endoscopy as well as colonoscopy which were unremarkable except for mild erosive esophagitis.  Random colon biopsies were normal.  Patient felt like her symptoms were not addressed and she followed up at [facility].  She underwent sits marker study at [facility] which revealed moderate stool burden and retention of sitz markers.  Subsequently, she was recommended to undergo anorectal manometry and she did not pursue it at that time.  Patient does have history of anxiety and she notices anxiety triggers her GI symptoms.  She does drink sodas, sweet tea regularly as well as consumes red meat few times a week.  Her weight has been stable.  Her labs including CBC, CMP were unremarkable, normal HbA1c.  She does have history of hypothyroidism, currently on thyroid replacement therapy  Follow-up video visit 05/10/2021 I saw Julie Harrison end of May for chronic diarrhea and cramps after she underwent work-up by Dr. Norma Fredrickson.  I checked her pancreatic fecal elastase levels which confirmed moderate pancreatic insufficiency.  Her levels were 128.  I recommended Zenpep 40 K 2 capsules with each meal and 1 with snack.  Patient had several questions regarding the new diagnosis and therefore we have arranged MyChart video  visit. Patient has not started Zenpep yet.  Patient did not get the CT scan done yet  Patient does not smoke or drink alcohol Ex tobacco use  Follow-up visit 06/20/2021 Patient is here for follow-up of new diagnosis of EPI.  Her pancreatic fecal elastase levels were 128.  She has started Zenpep 2 capsules with each meal and 1 with snack.  She reports significant improvement in abdominal cramps and bloating.  She is not experiencing diarrhea.  She has regular bowel movement every 2 days without any significant straining or hard stools.  Her weight has been stable.  NSAIDs: None  Antiplts/Anticoagulants/Anti thrombotics: None  GI Procedures:  -Colonoscopy:08/05/2017 - Normal random colon bx, TA polyp x 1, diverticulosis, internal hemorroids - EGD: 08/05/2017 - Gastritis, no h pylori, LA Grade A esophagitis  Sits marker study at [facility] 12/16/2019 FINDINGS:  Thirty-one Sitz markers are noted throughout the colon. The distal  most marker is likely at the level of the distal sigmoid colon. The  bowel gas pattern is nonobstructive. There is a moderate amount of  stool throughout the colon. There are degenerative changes of the  visualized thoracolumbar spine and bilateral hips, left worse than  right.   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 No pertinent surgical history.  Current Outpatient Medications:  albuterol (VENTOLIN HFA) 108 (90 Base) MCG/ACT inhaler, INHALE 1 2 PUFFS BY MOUTH EVERY 4 HOURS AS NEEDED FOR WHEEZING, Disp: , Rfl:    buPROPion (WELLBUTRIN XL) 300 MG 24 hr tablet, TAKE 1 TABLET BY MOUTH EVERY DAY, Disp: 90 tablet, Rfl: 1   Calcium Carb-Cholecalciferol (OYSTER SHELL CALCIUM) 500-400 MG-UNIT TABS, Take by mouth., Disp: , Rfl:     Cholecalciferol 10 MCG (400 UNIT) CHEW, Chew by mouth., Disp: , Rfl:    FLUoxetine (PROZAC) 10 MG capsule, TAKE 1 CAPSULE (10 MG TOTAL) BY MOUTH DAILY WITH BREAKFAST., Disp: 90 capsule, Rfl: 1   levothyroxine (SYNTHROID) 25 MCG tablet, Take by mouth., Disp: , Rfl:    lisinopril (ZESTRIL) 10 MG tablet, Take 10 mg by mouth daily., Disp: , Rfl:    loratadine (CLARITIN) 10 MG tablet, Take by mouth., Disp: , Rfl:    Multiple Vitamin (MULTI-VITAMIN) tablet, Take 1 tablet by mouth daily., Disp: , Rfl:    Omega-3 Fatty Acids (FISH OIL) 1000 MG CAPS, Take by mouth., Disp: , Rfl:    Pancrelipase, Lip-Prot-Amyl, (ZENPEP) 40000-126000 units CPEP, TAKE 2 CAPSULES WITH THE FIRST BITE OF EACH MEAL AND 1 CAPSULE WITH THE FIRST BITE OF EACH SNACK, Disp: 240 capsule, Rfl: 1   pantoprazole (PROTONIX) 40 MG tablet, Take 40 mg by mouth daily., Disp: , Rfl:    rosuvastatin (CRESTOR) 10 MG tablet, Take 10 mg by mouth daily., Disp: , Rfl:    alendronate (FOSAMAX) 70 MG tablet, Take by mouth. (Patient not taking: No sig reported), Disp: , Rfl:    ibandronate (BONIVA) 150 MG tablet, Take 150 mg by mouth every morning. (Patient not taking: No sig reported), Disp: , Rfl:    levothyroxine (SYNTHROID, LEVOTHROID) 75 MCG tablet, TAKE 1 TABLET BY MOUTH EVERY DAY FOR LOW THYROID (Patient not taking: Reported on 06/20/2021), Disp: , Rfl: 2   lisinopril (PRINIVIL,ZESTRIL) 5 MG tablet, Take 5 mg by mouth daily. for high blood pressure (Patient not taking: No sig reported), Disp: , Rfl: 1   traZODone (DESYREL) 50 MG tablet, TAKE 1/2 TO 1 TABLET (25-50 MG TOTAL) BY MOUTH AT BEDTIME AS NEEDED FOR SLEEP. (Patient not taking: Reported on 06/20/2021), Disp: 90 tablet, Rfl: 1   Family History  Problem Relation Age of Onset   Depression Mother    Alcohol abuse Father    Alcohol abuse Paternal Uncle    Depression Maternal Grandmother    Breast cancer Maternal Grandmother 60   Breast cancer Maternal Aunt 35     Social History   Tobacco  Use   Smoking status: Former    Packs/day: 0.25    Types: Cigarettes    Quit date: 03/22/2018    Years since quitting: 3.2   Smokeless tobacco: Never  Vaping Use   Vaping Use: Former  Substance Use Topics   Alcohol use: Yes    Alcohol/week: 2.0 standard drinks    Types: 2 Glasses of wine per week   Drug use: Yes    Types: Marijuana    Comment: about a month ago    Allergies as of 06/20/2021 - Review Complete 06/20/2021  Allergen Reaction Noted   Hydralazine hcl Itching 07/12/2017   Erythromycin Nausea Only 11/08/2016   Hydralazine Itching 07/12/2017   Hydrochlorothiazide Itching 11/08/2016   Vortioxetine Nausea Only 11/08/2016    Review of Systems:    All systems reviewed and negative except where noted in HPI.   Physical Exam:  BP (!) 175/89 (BP Location: Right Leg, Patient  Position: Sitting, Cuff Size: Normal)   Pulse 86   Temp 98.6 F (37 C) (Oral)   Ht 4\' 11"  (1.499 m)   Wt 119 lb (54 kg)   BMI 24.04 kg/m  No LMP recorded. Patient is postmenopausal.  General:   Alert,  Well-developed, well-nourished, pleasant and cooperative in NAD Head:  Normocephalic and atraumatic. Eyes:  Sclera clear, no icterus.   Conjunctiva pink. Ears:  Normal auditory acuity. Nose:  No deformity, discharge, or lesions. Mouth:  No deformity or lesions,oropharynx pink & moist. Neck:  Supple; no masses or thyromegaly. Lungs:  Respirations even and unlabored.  Clear throughout to auscultation.   No wheezes, crackles, or rhonchi. No acute distress. Heart:  Regular rate and rhythm; no murmurs, clicks, rubs, or gallops. Abdomen:  Normal bowel sounds. Soft, non-tender and nondistended without masses, hepatosplenomegaly or hernias noted.  No guarding or rebound tenderness.   Rectal: Not performed Msk:  Symmetrical without gross deformities. Good, equal movement & strength bilaterally. Pulses:  Normal pulses noted. Extremities:  No clubbing or edema.  No cyanosis. Neurologic:  Alert and oriented  x3;  grossly normal neurologically. Skin:  Intact without significant lesions or rashes. No jaundice. Psych:  Alert and cooperative. Normal mood and affect.  Imaging Studies: Reviewed  Assessment and Plan:   Julie Harrison is a 70 y.o. pleasant Caucasian female with history of anxiety, depression, history of hypothyroidism, hypertension, history of tobacco use on Wellbutrin is seen in consultation for chronic symptoms of abdominal bloating, abdominal cramps as well as postprandial urgency, loose stools without rectal bleeding. EGD and colonoscopy were unremarkable, no evidence of H. pylori, random colon biopsies were normal.  Stool studies confirm moderate exocrine pancreatic insufficiency  Moderate EPI: Symptoms resolved Continue Zenpep 40K 1 to 2 capsules with each meal and 1 with snack Advised her to get CT scan scheduled, order already placed and faxed to [facility] in [place]   Follow up in 6 months   [place], MD

## 2021-07-03 ENCOUNTER — Encounter: Payer: Self-pay | Admitting: Gastroenterology

## 2021-07-11 ENCOUNTER — Other Ambulatory Visit: Payer: Self-pay

## 2021-07-11 ENCOUNTER — Ambulatory Visit (INDEPENDENT_AMBULATORY_CARE_PROVIDER_SITE_OTHER): Payer: Medicare Other | Admitting: Vascular Surgery

## 2021-07-11 ENCOUNTER — Encounter (INDEPENDENT_AMBULATORY_CARE_PROVIDER_SITE_OTHER): Payer: Self-pay | Admitting: Vascular Surgery

## 2021-07-11 VITALS — BP 158/81 | HR 86 | Ht 59.0 in | Wt 123.0 lb

## 2021-07-11 DIAGNOSIS — E782 Mixed hyperlipidemia: Secondary | ICD-10-CM | POA: Diagnosis not present

## 2021-07-11 DIAGNOSIS — I7300 Raynaud's syndrome without gangrene: Secondary | ICD-10-CM | POA: Diagnosis not present

## 2021-07-11 DIAGNOSIS — I10 Essential (primary) hypertension: Secondary | ICD-10-CM

## 2021-07-11 NOTE — Assessment & Plan Note (Signed)
blood pressure control important in reducing the progression of atherosclerotic disease. On appropriate oral medications.  

## 2021-07-11 NOTE — Assessment & Plan Note (Signed)
Patient clearly has symptoms of Raynaud's disease.  We discussed the pathophysiology and natural history of Raynaud's disease.  She has not had tissue loss or limb threatening symptoms.  Her symptoms are actually fairly mild at this point.  We discussed avoidance of cold stimulation.  We discussed wearing gloves and socks.  We are going to evaluate her lower extremity with ABIs to ensure the concomitant peripheral arterial disease is not present due to some symptoms and concerns in her legs.  Her symptoms are not severe enough at this time to warrant medication or other agents.

## 2021-07-11 NOTE — Progress Notes (Signed)
Patient ID: Julie Harrison, female   DOB: Apr 20, 1951, 70 y.o.   MRN: [id number]  Chief Complaint  Patient presents with   New Patient (Initial Visit)    NP Dr. Nemiah Commander dx raynaud's syndrome w/o gangrene     HPI Julie Harrison is a 70 y.o. female.  I am asked to see the patient by Dr. Nemiah Commander for evaluation of Raynauds disease.  She has noticed for the past couple of years that cold stimulation creates marked discoloration particularly in her hands but she also notices in her feet.  Her feet are cooler than normal at a baseline, and she has some occasional leg pain with activity.  She does have other atherosclerotic risk factors and a family history of vascular disease who this was worrisome to her.  She does not have a lot of pain with these episodes of discoloration.  She says if she opens the freezer or goes out in the cold, her hands basically turned pale and white and after several minutes they will become hyperemic and red.  They become hyperemic she has sensations in her fingers but it is not overt pain.  She is never had ulceration or tissue loss.  No fevers or chills.     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   Family History  Problem Relation Age of Onset   Depression Mother    Alcohol abuse Father    Alcohol abuse Paternal Uncle    Depression Maternal Grandmother    Breast cancer Maternal Grandmother 21   Breast cancer Maternal Aunt 7      Social History   Tobacco Use   Smoking status: Former    Packs/day: 0.25    Types: Cigarettes    Quit date: 03/22/2018    Years since quitting: 3.3   Smokeless tobacco: Never  Vaping Use   Vaping Use: Former  Substance Use Topics   Alcohol use: Yes    Alcohol/week: 2.0  standard drinks    Types: 2 Glasses of wine per week   Drug use: Yes    Types: Marijuana    Comment: about a month ago     Allergies  Allergen Reactions   Hydralazine Hcl Itching   Erythromycin Nausea Only   Hydralazine Itching   Hydrochlorothiazide Itching   Vortioxetine Nausea Only    Current Outpatient Medications  Medication Sig Dispense Refill   albuterol (VENTOLIN HFA) 108 (90 Base) MCG/ACT inhaler INHALE 1 2 PUFFS BY MOUTH EVERY 4 HOURS AS NEEDED FOR WHEEZING     alendronate (FOSAMAX) 70 MG tablet Take by mouth.     buPROPion (WELLBUTRIN XL) 300 MG 24 hr tablet TAKE 1 TABLET BY MOUTH EVERY DAY 90 tablet 1   Calcium Carb-Cholecalciferol (OYSTER SHELL CALCIUM) 500-400 MG-UNIT TABS Take by mouth.     Cholecalciferol 10 MCG (400 UNIT) CHEW Chew by mouth.     FLUoxetine (PROZAC) 10 MG capsule TAKE 1 CAPSULE (10 MG TOTAL) BY MOUTH DAILY WITH BREAKFAST. 90 capsule 1   ibandronate (BONIVA) 150 MG tablet Take 150 mg by mouth every morning.     levothyroxine (SYNTHROID) 25 MCG tablet Take by mouth.     levothyroxine (SYNTHROID, LEVOTHROID) 75  MCG tablet   2   lisinopril (PRINIVIL,ZESTRIL) 5 MG tablet Take 5 mg by mouth daily. for high blood pressure  1   lisinopril (ZESTRIL) 10 MG tablet Take 10 mg by mouth daily.     loratadine (CLARITIN) 10 MG tablet Take by mouth.     Multiple Vitamin (MULTI-VITAMIN) tablet Take 1 tablet by mouth daily.     Omega-3 Fatty Acids (FISH OIL) 1000 MG CAPS Take by mouth.     Pancrelipase, Lip-Prot-Amyl, (ZENPEP) 40000-126000 units CPEP TAKE 2 CAPSULES WITH THE FIRST BITE OF EACH MEAL AND 1 CAPSULE WITH THE FIRST BITE OF EACH SNACK 240 capsule 1   pantoprazole (PROTONIX) 40 MG tablet Take 40 mg by mouth daily.     rosuvastatin (CRESTOR) 10 MG tablet Take 10 mg by mouth daily.     traZODone (DESYREL) 50 MG tablet TAKE 1/2 TO 1 TABLET (25-50 MG TOTAL) BY MOUTH AT BEDTIME AS NEEDED FOR SLEEP. 90 tablet 1   No current facility-administered medications for  this visit.      REVIEW OF SYSTEMS (Negative unless checked)  Constitutional: [] Weight loss  [] Fever  [] Chills Cardiac: [] Chest pain   [] Chest pressure   [] Palpitations   [] Shortness of breath when laying flat   [] Shortness of breath at rest   [] Shortness of breath with exertion. Vascular:  [] Pain in legs with walking   [] Pain in legs at rest   [] Pain in legs when laying flat   [] Claudication   [] Pain in feet when walking  [] Pain in feet at rest  [] Pain in feet when laying flat   [] History of DVT   [] Phlebitis   [] Swelling in legs   [] Varicose veins   [] Non-healing ulcers Pulmonary:   [] Uses home oxygen   [] Productive cough   [] Hemoptysis   [] Wheeze  [] COPD   [] Asthma Neurologic:  [] Dizziness  [] Blackouts   [] Seizures   [] History of stroke   [] History of TIA  [] Aphasia   [] Temporary blindness   [] Dysphagia   [] Weakness or numbness in arms   [] Weakness or numbness in legs Musculoskeletal:  [x] Arthritis   [] Joint swelling   [x] Joint pain   [] Low back pain Hematologic:  [] Easy bruising  [] Easy bleeding   [] Hypercoagulable state   [] Anemic  [] Hepatitis Gastrointestinal:  [] Blood in stool   [] Vomiting blood  [] Gastroesophageal reflux/heartburn   [] Abdominal pain Genitourinary:  [] Chronic kidney disease   [] Difficult urination  [] Frequent urination  [] Burning with urination   [] Hematuria Skin:  [] Rashes   [] Ulcers   [] Wounds Psychological:  [] History of anxiety   []  History of major depression.    Physical Exam BP (!) 158/81   Pulse 86   Ht 4\' 11"  (1.499 m)   Wt 123 lb (55.8 kg)   BMI 24.84 kg/m  Gen:  WD/WN, NAD Head: [place]/AT, No temporalis wasting.  Ear/Nose/Throat: Hearing grossly intact, nares w/o erythema or drainage, oropharynx w/o Erythema/Exudate Eyes: Conjunctiva clear, sclera non-icteric  Neck: trachea midline.  No JVD.  Pulmonary:  Good air movement, respirations not labored, no use of accessory muscles  Cardiac: RRR, no JVD Vascular:  Vessel Right Left  Radial 2+ 2+                           DP 1+ 1+  PT 1+ 1+   Gastrointestinal:. No masses, surgical incisions, or scars. Musculoskeletal: M/S 5/5 throughout.  Extremities without ischemic changes.  Arthritic changes to the hands. No edema. Neurologic: Sensation grossly  intact in extremities.  Symmetrical.  Speech is fluent. Motor exam as listed above. Psychiatric: Judgment intact, Mood & affect appropriate for pt's clinical situation. Dermatologic: No rashes or ulcers noted.  No cellulitis or open wounds.    Radiology No results found.  Labs No results found for this or any previous visit (from the past 2160 hour(s)).   Assessment/Plan:  Raynaud disease Patient clearly has symptoms of Raynaud's disease.  We discussed the pathophysiology and natural history of Raynaud's disease.  She has not had tissue loss or limb threatening symptoms.  Her symptoms are actually fairly mild at this point.  We discussed avoidance of cold stimulation.  We discussed wearing gloves and socks.  We are going to evaluate her lower extremity with ABIs to ensure the concomitant peripheral arterial disease is not present due to some symptoms and concerns in her legs.  Her symptoms are not severe enough at this time to warrant medication or other agents.  Benign essential HTN blood pressure control important in reducing the progression of atherosclerotic disease. On appropriate oral medications.   Hyperlipidemia, mixed lipid control important in reducing the progression of atherosclerotic disease. Continue statin therapy      Festus Barren 07/11/2021, 12:07 PM   This note was created with Dragon medical transcription system.  Any errors from dictation are unintentional.

## 2021-07-11 NOTE — Patient Instructions (Signed)
Raynaud's Phenomenon °Raynaud's phenomenon is a condition that affects the blood vessels (arteries) that carry blood to the fingers and toes. The arteries that supply blood to the ears, lips, nipples, or the tip of the nose might also be affected. Raynaud's phenomenon causes the arteries to become narrow temporarily (spasm). As a result, the flow of blood to the affected areas is temporarily decreased. This usually occurs in response to cold temperatures or stress. During an attack, the skin in the affected areas turns white, then blue, and finally red. A person may also feel tingling or numbness in those areas. °Attacks usually last for only a brief period, and then the blood flow to the area returns to normal. In most cases, Raynaud's phenomenon does not cause serious health problems. °What are the causes? °In many cases, the cause of this condition is not known. The condition may occur on its own (primary Raynaud's phenomenon) or may be associated with other diseases or factors (secondary Raynaud's phenomenon). °Possible causes may include: °Diseases or medical conditions that damage the arteries. °Injuries and repetitive actions that hurt the hands or feet. °Being exposed to certain chemicals. °Taking medicines that narrow the arteries. °Other medical conditions, such as lupus, scleroderma, rheumatoid arthritis, thyroid problems, blood disorders, Sjogren syndrome, or atherosclerosis. °What increases the risk? °The following factors may make you more likely to develop this condition: °Being 20-40 years old. °Being female. °Having a family history of Raynaud's phenomenon. °Living in a cold climate. °Smoking. °What are the signs or symptoms? °Symptoms of this condition usually occur when you are exposed to cold temperatures or when you have emotional stress. The symptoms may last for a few minutes or up to several hours. They usually affect your fingers but may also affect your toes, nipples, lips, ears, or the tip  of your nose. Symptoms may include: °Changes in skin color. The skin in the affected areas will turn pale or white. The skin may then change from white to bluish to red as normal blood flow returns to the area. °Numbness, tingling, or pain in the affected areas. °In severe cases, symptoms may include: °Skin sores. °Tissues decaying and dying (gangrene). °How is this diagnosed? °This condition may be diagnosed based on: °Your symptoms and medical history. °A physical exam. During the exam, you may be asked to put your hands in cold water to check for a reaction to cold temperature. °Tests, such as: °Blood tests to check for other diseases or conditions. °A test to check the movement of blood through your arteries and veins (vascular ultrasound). °A test in which the skin at the base of your fingernail is examined under a microscope (nailfold capillaroscopy). °How is this treated? °During an episode, you can take actions to help symptoms go away faster. Options include moving your arms around in a windmill pattern, warming your fingers under warm water, or placing your fingers in a warm body fold, such as your armpit. °Long-term treatment for this condition often involves making lifestyle changes and taking steps to control your exposure to cold temperature. For more severe cases, medicine (calcium channel blockers) may be used to improve blood circulation. °Follow these instructions at home: °Avoiding cold temperatures °Take these steps to avoid exposure to cold: °If possible, stay indoors during cold weather. °When you go outside during cold weather, dress in layers and wear mittens, a hat, a scarf, and warm footwear. °Wear mittens or gloves when handling ice or frozen food. °Use holders for glasses or cans containing cold   drinks. °Let warm water run for a while before taking a shower or bath. °Warm up the car before driving in cold weather. °Lifestyle °If possible, avoid stressful and emotional situations. Try to  find ways to manage your stress, such as: °Exercise. °Yoga. °Meditation. °Biofeedback. °Do not use any products that contain nicotine or tobacco. These products include cigarettes, chewing tobacco, and vaping devices, such as e-cigarettes. If you need help quitting, ask your health care provider. °Avoid secondhand smoke. °Limit your use of caffeine. °Switch to decaffeinated coffee, tea, and soda. °Avoid chocolate. °Avoid vibrating tools and machinery. °General instructions °Protect your hands and feet from injuries, cuts, or bruises. °Avoid wearing tight rings or wristbands. °Wear loose fitting socks and comfortable, roomy shoes. °Take over-the-counter and prescription medicines only as told by your health care provider. °Where to find support °Raynaud's Association: www.raynauds.org °Where to find more information °National Institute of Arthritis and Musculoskeletal and Skin Diseases: www.niams.nih.gov °Contact a health care provider if: °Your discomfort becomes worse despite lifestyle changes. °You develop sores on your fingers or toes that do not heal. °You have breaks in the skin on your fingers or toes. °You have a fever. °You have pain or swelling in your joints. °You have a rash. °Your symptoms occur on only one side of your body. °Get help right away if: °Your fingers or toes turn black. °You have severe pain in the affected areas. °These symptoms may represent a serious problem that is an emergency. Do not wait to see if the symptoms will go away. Get medical help right away. Call your local emergency services (911 in the [place] Do not drive yourself to the hospital. °Summary °Raynaud's phenomenon is a condition that affects the arteries that carry blood to the fingers, toes, ears, lips, nipples, or the tip of the nose. °In many cases, the cause of this condition is not known. °Symptoms of this condition include changes in skin color along with numbness and tingling in the affected area. °Treatment for this  condition includes lifestyle changes and reducing exposure to cold temperatures. Medicines may be used for severe cases of the condition. °Contact your health care provider if your condition worsens despite treatment. °This information is not intended to replace advice given to you by your health care provider. Make sure you discuss any questions you have with your health care provider. °Document Revised: 12/06/2020 Document Reviewed: 12/06/2020 °Elsevier Patient Education © 2022 [employer] ° °

## 2021-07-11 NOTE — Assessment & Plan Note (Signed)
lipid control important in reducing the progression of atherosclerotic disease. Continue statin therapy  

## 2021-07-18 ENCOUNTER — Ambulatory Visit: Payer: Medicare Other | Admitting: Psychiatry

## 2021-07-19 ENCOUNTER — Other Ambulatory Visit: Payer: Self-pay

## 2021-07-19 ENCOUNTER — Ambulatory Visit (INDEPENDENT_AMBULATORY_CARE_PROVIDER_SITE_OTHER): Payer: Medicare Other | Admitting: Licensed Clinical Social Worker

## 2021-07-19 DIAGNOSIS — F411 Generalized anxiety disorder: Secondary | ICD-10-CM | POA: Diagnosis not present

## 2021-07-19 DIAGNOSIS — F3176 Bipolar disorder, in full remission, most recent episode depressed: Secondary | ICD-10-CM | POA: Diagnosis not present

## 2021-07-19 NOTE — Plan of Care (Signed)
  Problem: Alleviate depressive/manic symptoms and return to improved levels of effective functioning. Goal: LTG: Stabilize mood and increase goal-directed behavior: 3 out of 5 opportunities to do so Outcome: Progressing Goal: STG: Julie Harrison WILL IDENTIFY COGNITIVE PATTERNS AND BELIEFS THAT INTERFERE WITH THERAPY Outcome: Progressing Intervention: Assist with coping skills and behavior Intervention: Encourage new environment or opportunities for social interaction Intervention: Encourage patient to identify triggers Intervention: REVIEW PLEASE SKILLS (TREAT PHYSICAL ILLNESS, BALANCE EATING, AVOID MOOD-ALTERING SUBSTANCES, BALANCE SLEEP AND GET EXERCISE) WITH [name]

## 2021-07-19 NOTE — Progress Notes (Signed)
   THERAPIST PROGRESS NOTE  Session Time: 9-10a  Participation Level: Active  Behavioral Response: Neat and Well GroomedAlertAnxious  Type of Therapy: Individual Therapy  Treatment Goals addressed:  Problem: Alleviate depressive/manic symptoms and return to improved levels of effective functioning.  Goal: LTG: Stabilize mood and increase goal-directed behavior: 3 out of 5 opportunities to do so Outcome: Progressing  Goal: STG: Areen WILL IDENTIFY COGNITIVE PATTERNS AND BELIEFS THAT INTERFERE WITH THERAPY Outcome: Progressing  Interventions:  Intervention: Assist with coping skills and behavior  Intervention: Encourage new environment or opportunities for social interaction  Intervention: Encourage patient to identify triggers  Intervention: REVIEW PLEASE SKILLS (TREAT PHYSICAL ILLNESS, BALANCE EATING, AVOID MOOD-ALTERING SUBSTANCES, BALANCE SLEEP AND GET EXERCISE) WITH [name]  Summary: Julie Harrison is a 70 y.o. female who presents with improving symptoms related to bipolar disorder and GAD. Pt reports that overall mood is stable and that she is managing overall anxiety and stressors well.  Reviewed coping skills and discussed additional coping strategies.  Allowed pt to explore and express thoughts and feelings associated with recent life situations and external stressors. Discussed relationship with husband, relationship with mother, and relationship with grandsons. Allowed pt to explore behaviors within herself and discuss behavior patterns pt has recognized throughout the years.  Reviewed coping skills and self-regulation tools pt is currently using.   Continued recommendations are as follows: self care behaviors, positive social engagements, focusing on overall work/home/life balance, and focusing on positive physical and emotional wellness.   Suicidal/Homicidal: No  Therapist Response: Pt is continuing to apply interventions learned in session into daily life  situations. Pt is currently on track to meet goals utilizing interventions mentioned above. Personal growth and progress noted. Treatment to continue as indicated.   Plan: Return again in 4 weeks.  Diagnosis: Axis I: Bipolar disorder; GAD    Axis II: No diagnosis    [name] Baylen Dea, LCSW 07/19/2021

## 2021-07-24 ENCOUNTER — Other Ambulatory Visit: Payer: Self-pay | Admitting: *Deleted

## 2021-07-24 DIAGNOSIS — Z87891 Personal history of nicotine dependence: Secondary | ICD-10-CM

## 2021-08-02 ENCOUNTER — Ambulatory Visit: Payer: Medicare Other | Admitting: Psychiatry

## 2021-08-09 ENCOUNTER — Other Ambulatory Visit: Payer: Self-pay

## 2021-08-09 ENCOUNTER — Encounter: Payer: Self-pay | Admitting: Acute Care

## 2021-08-09 ENCOUNTER — Ambulatory Visit
Admission: RE | Admit: 2021-08-09 | Discharge: 2021-08-09 | Disposition: A | Discharge status: Home / Self Care | Payer: Medicare Other | Source: Ambulatory Visit | Attending: Acute Care | Admitting: Acute Care

## 2021-08-09 ENCOUNTER — Telehealth (INDEPENDENT_AMBULATORY_CARE_PROVIDER_SITE_OTHER): Payer: Medicare Other | Admitting: Acute Care

## 2021-08-09 DIAGNOSIS — Z87891 Personal history of nicotine dependence: Secondary | ICD-10-CM | POA: Insufficient documentation

## 2021-08-09 NOTE — Progress Notes (Signed)
Virtual Visit via Video Note  I connected with Julie Harrison on 08/09/21 at 10:30 AM EDT by a video enabled telemedicine application and verified that I am speaking with the correct person using two identifiers.  Location: Patient: At home Provider: [address], [place], [place], [address]    I discussed the limitations of evaluation and management by telemedicine and the availability of in person appointments. The patient expressed understanding and agreed to proceed.  Shared Decision Making Visit Lung Cancer Screening Program [phone])   Eligibility: Age 70 y.o. Pack Years Smoking History Calculation 50 pack years (# packs/per year x # years smoked) Recent History of coughing up blood  no Unexplained weight loss? no ( >Than 15 pounds within the last 6 months ) Prior History Lung / other cancer no (Diagnosis within the last 5 years already requiring surveillance chest CT Scans). Smoking Status Former Smoker Former Smokers: Years since quit: 3 years  Quit Date: 03/22/2018  Visit Components: Discussion included one or more decision making aids. yes Discussion included risk/benefits of screening. yes Discussion included potential follow up diagnostic testing for abnormal scans. yes Discussion included meaning and risk of over diagnosis. yes Discussion included meaning and risk of False Positives. yes Discussion included meaning of total radiation exposure. yes  Counseling Included: Importance of adherence to annual lung cancer LDCT screening. yes Impact of comorbidities on ability to participate in the program. yes Ability and willingness to under diagnostic treatment. yes  Smoking Cessation Counseling: Current Smokers:  Discussed importance of smoking cessation. yes Information about tobacco cessation classes and interventions provided to patient. yes Patient provided with "ticket" for LDCT Scan. yes Symptomatic Patient. no  Counseling NA Diagnosis Code: Tobacco Use  Z72.0 Asymptomatic Patient yes  Counseling (Intermediate counseling: > three minutes counseling) [id number] Former Smokers:  Discussed the importance of maintaining cigarette abstinence. yes Diagnosis Code: Personal History of Nicotine Dependence. T73.220 Information about tobacco cessation classes and interventions provided to patient. Yes Patient provided with "ticket" for LDCT Scan. yes Written Order for Lung Cancer Screening with LDCT placed in Epic. Yes (CT Chest Lung Cancer Screening Low Dose W/O CM) [id number] Z12.2-Screening of respiratory organs Z87.891-Personal history of nicotine dependence  I spent 25 minutes of face to face time with [name] discussing the risks and benefits of lung cancer screening. We viewed a power point together that explained in detail the above noted topics. We took the time to pause the power point at intervals to allow for questions to be asked and answered to ensure understanding. We discussed that she had taken the single most powerful action possible to decrease her risk of developing lung cancer when she quit smoking. I counseled her to remain smoke free, and to contact me if she ever had the desire to smoke again so that I can provide resources and tools to help support the effort to remain smoke free. We discussed the time and location of the scan, and that either  Abigail Miyamoto RN or I will call with the results within  24-48 hours of receiving them. She has my card and contact information in the event she needs to speak with me, in addition to a copy of the power point we reviewed as a resource. She verbalized understanding of all of the above and had no further questions upon leaving the office.     I explained to the patient that there has been a high incidence of coronary artery disease noted on these exams. I explained  that this is a non-gated exam therefore degree or severity cannot be determined. This patient is currently on statin therapy. I have asked  the patient to follow-up with their PCP regarding any incidental finding of coronary artery disease and management with diet or medication as they feel is clinically indicated. The patient verbalized understanding of the above and had no further questions.     Bevelyn Ngo, NP 08/09/2021

## 2021-08-09 NOTE — Patient Instructions (Signed)
Thank you for participating in the [facility] Scre[facility]. It was our pleasure to meet you today. We will call you with the results of your scan within the next few days. Your scan will be assigned a Lung RADS category score by the physicians reading the scans.  This Lung RADS score determines follow up scanning.  See below for description of categories, and follow up screening recommendations. We will be in touch to schedule your follow up screening annually or based on recommendations of our providers. We will fax a copy of your scan results to your Primary Care Physician, or the physician who referred you to the program, to ensure they have the results. Please call the office if you have any questions or concerns regarding your scanning experience or results.  Our office number is [phone]. Please speak with Denise Phelps, RN. She is our Lung Cancer Screening RN. If she is unavailable when you call, please have the office staff send her a message. She will return your call at her earliest convenience. Remember, if your scan is normal, we will scan you annually as long as you continue to meet the criteria for the program. (Age 55-77, Current smoker or smoker who has quit within the last 15 years). If you are a smoker, remember, quitting is the single most powerful action that you can take to decrease your risk of lung cancer and other pulmonary, breathing related problems. We know quitting is hard, and we are here to help.  Please let us know if there is anything we can do to help you meet your goal of quitting. If you are a former smoker, congratulations. We are proud of you! Remain smoke free! Remember you can refer friends or family members through the number above.  We will screen them to make sure they meet criteria for the program. Thank you for helping us take better care of you by participating in Lung Screening.  Lung RADS Categories:  Lung RADS 1: no nodules  or definitely non-concerning nodules.  Recommendation is for a repeat annual scan in 12 months.  Lung RADS 2:  nodules that are non-concerning in appearance and behavior with a very low likelihood of becoming an active cancer. Recommendation is for a repeat annual scan in 12 months.  Lung RADS 3: nodules that are probably non-concerning , includes nodules with a low likelihood of becoming an active cancer.  Recommendation is for a 6-month repeat screening scan. Often noted after an upper respiratory illness. We will be in touch to make sure you have no questions, and to schedule your 6-month scan.  Lung RADS 4 A: nodules with concerning findings, recommendation is most often for a follow up scan in 3 months or additional testing based on our provider's assessment of the scan. We will be in touch to make sure you have no questions and to schedule the recommended 3 month follow up scan.  Lung RADS 4 B:  indicates findings that are concerning. We will be in touch with you to schedule additional diagnostic testing based on our provider's  assessment of the scan.   

## 2021-08-16 ENCOUNTER — Encounter: Payer: Self-pay | Admitting: Psychiatry

## 2021-08-16 ENCOUNTER — Ambulatory Visit (INDEPENDENT_AMBULATORY_CARE_PROVIDER_SITE_OTHER): Payer: Medicare Other | Admitting: Psychiatry

## 2021-08-16 ENCOUNTER — Other Ambulatory Visit: Payer: Self-pay

## 2021-08-16 ENCOUNTER — Other Ambulatory Visit: Payer: Self-pay | Admitting: Acute Care

## 2021-08-16 VITALS — BP 136/67 | HR 65 | Temp 98.1°F | Wt 123.0 lb

## 2021-08-16 DIAGNOSIS — F411 Generalized anxiety disorder: Secondary | ICD-10-CM

## 2021-08-16 DIAGNOSIS — F4010 Social phobia, unspecified: Secondary | ICD-10-CM

## 2021-08-16 DIAGNOSIS — F3176 Bipolar disorder, in full remission, most recent episode depressed: Secondary | ICD-10-CM

## 2021-08-16 DIAGNOSIS — G4700 Insomnia, unspecified: Secondary | ICD-10-CM

## 2021-08-16 DIAGNOSIS — Z87891 Personal history of nicotine dependence: Secondary | ICD-10-CM

## 2021-08-16 MED ORDER — FLUOXETINE HCL 10 MG PO CAPS: 10.0000 mg | ORAL_CAPSULE | Freq: Every day | ORAL | 1 refills | Status: DC

## 2021-08-16 MED ORDER — BUPROPION HCL ER (XL) 300 MG PO TB24: 300.0000 mg | ORAL_TABLET | Freq: Every day | ORAL | 1 refills | Status: DC

## 2021-08-16 NOTE — Progress Notes (Signed)
BH MD OP Progress Note  08/16/2021 10:11 AM Julie Harrison  MRN:  [id number]  Chief Complaint:  Chief Complaint   Follow-up; Anxiety; Depression    HPI: Julie Harrison is a 70 year old Caucasian female, engaged, retired, lives in [place], has a history of bipolar disorder, GAD, social anxiety disorder, hypothyroidism, hypertension was evaluated in office today.  Patient today reports psychosocial stressors of being the primary caregiver for her husband who has multiple health problems.  She hence has not been able to find time for herself and that has been anxiety provoking for her.  She does struggle with worrying about different things, worrying about her husband's health, worrying about her grandchildren.  Patient however reports she is not interested in changing any medications yet, would rather pursue psychotherapy and wants to stay on the current combination of medications.  Patient does report sleep problems, has been noncompliant with trazodone.  Encouraged to start taking it.  May also have restless leg symptoms.  Once she tries the trazodone she will be able to let writer know if she still has restless leg symptoms affecting her sleep or not.  Patient denies any suicidality, homicidality or perceptual disturbances.  Patient denies any other concerns today.  Visit Diagnosis:    ICD-10-CM   1. Bipolar disorder, in full remission, most recent episode depressed (HCC)  F31.76 buPROPion (WELLBUTRIN XL) 300 MG 24 hr tablet    2. GAD (generalized anxiety disorder)  F41.1 FLUoxetine (PROZAC) 10 MG capsule    3. Social anxiety disorder  F40.10 FLUoxetine (PROZAC) 10 MG capsule    4. Insomnia, unspecified type  G47.00       Past Psychiatric History: Reviewed past psychiatric history from progress note on 03/04/2018.  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 No pertinent surgical history.  Family Psychiatric History: Reviewed family psychiatric history from progress note on 03/04/2018  Family History:  Family History  Problem Relation Age of Onset   Depression Mother    Alcohol abuse Father    Alcohol abuse Paternal Uncle    Depression Maternal Grandmother    Breast cancer Maternal Grandmother 31   Breast cancer Maternal Aunt 72    Social History: Reviewed social history from progress note on 03/04/2018 Social History   Socioeconomic History   Marital status: Married    Spouse name: Not on file   Number of children: 2   Years of education: Not on file   Highest education level: Associate degree: occupational, Scientist, product/process development, or vocational program  Occupational History    Comment: retired  Tobacco Use   Smoking status: Former    Packs/day: 0.25    Types: Cigarettes    Quit date: 03/22/2018    Years since quitting: 3.4   Smokeless tobacco: Never  Vaping Use   Vaping Use: Former  Substance and Sexual Activity   Alcohol use: Yes    Alcohol/week: 2.0 standard drinks    Types: 2 Glasses of wine per week   Drug use: Yes    Types: Marijuana    Comment: about a month ago   Sexual activity: Yes  Other Topics Concern  Not on file  Social History Narrative   Not on file   Social Determinants of Health   Financial Resource Strain: Not on file  Food Insecurity: Not on file  Transportation Needs: Not on file  Physical Activity: Not on file  Stress: Not on file  Social Connections: Not on file    Allergies:  Allergies  Allergen Reactions   Hydralazine Hcl Itching   Erythromycin Nausea Only   Hydralazine Itching   Hydrochlorothiazide Itching   Vortioxetine Nausea Only    Metabolic Disorder Labs: No  results found for: HGBA1C, MPG No results found for: PROLACTIN No results found for: CHOL, TRIG, HDL, CHOLHDL, VLDL, LDLCALC No results found for: TSH  Therapeutic Level Labs: No results found for: LITHIUM No results found for: VALPROATE No components found for:  CBMZ  Current Medications: Current Outpatient Medications  Medication Sig Dispense Refill   Calcium Carb-Cholecalciferol (OYSTER SHELL CALCIUM) 500-400 MG-UNIT TABS Take by mouth.     Cholecalciferol 10 MCG (400 UNIT) CHEW Chew by mouth.     ezetimibe (ZETIA) 10 MG tablet Take 1 tablet by mouth daily.     levothyroxine (SYNTHROID) 50 MCG tablet Take by mouth.     lisinopril (ZESTRIL) 10 MG tablet Take 10 mg by mouth daily.     loratadine (CLARITIN) 10 MG tablet Take by mouth.     Multiple Vitamin (MULTI-VITAMIN) tablet Take 1 tablet by mouth daily.     Omega-3 Fatty Acids (FISH OIL) 1000 MG CAPS Take by mouth.     Pancrelipase, Lip-Prot-Amyl, (ZENPEP) 40000-126000 units CPEP TAKE 2 CAPSULES WITH THE FIRST BITE OF EACH MEAL AND 1 CAPSULE WITH THE FIRST BITE OF EACH SNACK 240 capsule 1   pantoprazole (PROTONIX) 40 MG tablet Take 40 mg by mouth daily.     simvastatin (ZOCOR) 20 MG tablet Take by mouth.     traZODone (DESYREL) 50 MG tablet Take 50 mg by mouth at bedtime.     buPROPion (WELLBUTRIN XL) 300 MG 24 hr tablet Take 1 tablet (300 mg total) by mouth daily. 90 tablet 1   FLUoxetine (PROZAC) 10 MG capsule Take 1 capsule (10 mg total) by mouth daily with breakfast. 90 capsule 1   No current facility-administered medications for this visit.     Musculoskeletal: Strength & Muscle Tone: within normal limits Gait & Station: normal Patient leans: N/A  Psychiatric Specialty Exam: Review of Systems  Psychiatric/Behavioral:  Positive for sleep disturbance. The patient is nervous/anxious.   All other systems reviewed and are negative.  Blood pressure 136/67, pulse 65, temperature 98.1 F (36.7 C), temperature source  Temporal, weight 123 lb (55.8 kg).Body mass index is 24.84 kg/m.  General Appearance: Casual  Eye Contact:  Fair  Speech:  Clear and Coherent  Volume:  Normal  Mood:  Anxious  Affect:  Congruent  Thought Process:  Goal Directed and Descriptions of Associations: Intact  Orientation:  Full (Time, Place, and Person)  Thought Content: Logical   Suicidal Thoughts:  No  Homicidal Thoughts:  No  Memory:  Immediate;   Fair Recent;   Fair Remote;   Fair  Judgement:  Fair  Insight:  Fair  Psychomotor Activity:  Normal  Concentration:  Concentration: Fair and Attention Span: Fair  Recall:  [employer] of Knowledge: Fair  Language: Fair  Akathisia:  No  Handed:  Right  AIMS (if indicated): done, 0  Assets:  Communication Skills Desire for Improvement Housing Transportation  ADL's:  Intact  Cognition: WNL  Sleep:  Poor   Screenings: GAD-7    Flowsheet Row Office Visit from 08/16/2021 in [facility]  Total GAD-7 Score 10      PHQ2-9    Flowsheet Row Office Visit from 08/16/2021 in [facility] Counselor from 07/19/2021 in [facility] Video Visit from 11/24/2020 in [facility]  PHQ-2 Total Score 1 0 0  PHQ-9 Total Score 13 -- --      Flowsheet Row Office Visit from 08/16/2021 in [facility] Counselor from 07/19/2021 in [facility] ED from 03/23/2021 in [facility]   C-SSRS RISK CATEGORY No Risk No Risk No Risk        Assessment and Plan: Julie Harrison is a 70 year old Caucasian female who has a history of bipolar disorder, GAD, hypothyroidism was evaluated in office today.  Patient with psychosocial stressors of recent death of a friend, current pandemic, being the primary caregiver for her husband, however is not interested in medication changes would rather like to pursue  psychotherapy.  Discussed plan as noted below.  Plan Bipolar disorder type II in remission Wellbutrin XL 300 mg p.o. daily Prozac 10 mg p.o. daily-reduced dosage  GAD-stable Continue CBT with Ms. Christina Hussami Prozac 10 mg p.o. daily  Social anxiety disorder-stable Continue CBT  Insomnia unspecified-unstable Restart trazodone 50 mg p.o. nightly as needed We will consider adding a medication for restless leg symptoms in the future as needed  Follow-up in clinic in 4 to 6 weeks or sooner in person.  This note was generated in part or whole with voice recognition software. Voice recognition is usually quite accurate but there are transcription errors that can and very often do occur. I apologize for any typographical errors that were not detected and corrected.         Jomarie Longs, MD 08/17/2021, 8:19 AM

## 2021-08-22 ENCOUNTER — Other Ambulatory Visit: Payer: Self-pay

## 2021-08-22 ENCOUNTER — Ambulatory Visit (INDEPENDENT_AMBULATORY_CARE_PROVIDER_SITE_OTHER): Payer: Medicare Other | Admitting: Licensed Clinical Social Worker

## 2021-08-22 DIAGNOSIS — F3176 Bipolar disorder, in full remission, most recent episode depressed: Secondary | ICD-10-CM

## 2021-08-22 DIAGNOSIS — F411 Generalized anxiety disorder: Secondary | ICD-10-CM

## 2021-08-22 NOTE — Plan of Care (Signed)
  Problem: Alleviate depressive/manic symptoms and return to improved levels of effective functioning. Goal: LTG: Stabilize mood and increase goal-directed behavior: 3 out of 5 opportunities to do so Outcome: Progressing Goal: STG: Julie Harrison WILL IDENTIFY COGNITIVE PATTERNS AND BELIEFS THAT INTERFERE WITH THERAPY Outcome: Progressing Intervention: Work with patient to identify the major components of a recent episode of anxiety: physical symptoms, major thoughts and images, and major behaviors they experienced Intervention: REVIEW PLEASE SKILLS (TREAT PHYSICAL ILLNESS, BALANCE EATING, AVOID MOOD-ALTERING SUBSTANCES, BALANCE SLEEP AND GET EXERCISE) WITH Julie Harrison Intervention: Educate patient on: Stress management

## 2021-08-22 NOTE — Progress Notes (Signed)
Virtual Visit via Audio Note  I connected with Julie Harrison on 08/22/21 at  9:00 AM EST by an audio enabled telemedicine application and verified that I am speaking with the correct person using two identifiers.  Location: Patient: home Provider: remote office [place], [place])   I discussed the limitations of evaluation and management by telemedicine and the availability of in person appointments. The patient expressed understanding and agreed to proceed.   I discussed the assessment and treatment plan with the patient. The patient was provided an opportunity to ask questions and all were answered. The patient agreed with the plan and demonstrated an understanding of the instructions.   The patient was advised to call back or seek an in-person evaluation if the symptoms worsen or if the condition fails to improve as anticipated.  I provided 60 minutes of non-face-to-face time during this encounter.   [name] R Artesha Wemhoff, LCSW   THERAPIST PROGRESS NOTE  Session Time: 9-10a  Participation Level: Active  Behavioral Response: NeatAlertAnxious  Type of Therapy: Individual Therapy  Treatment Goals addressed:  Problem: Alleviate depressive/manic symptoms and return to improved levels of effective functioning. Goal: LTG: Stabilize mood and increase goal-directed behavior: 3 out of 5 opportunities to do so Outcome: Progressing Goal: STG: [name] WILL IDENTIFY COGNITIVE PATTERNS AND BELIEFS THAT INTERFERE WITH THERAPY Outcome: Progressing Interventions:  Intervention: Work with patient to identify the major components of a recent episode of anxiety: physical symptoms, major thoughts and images, and major behaviors they experienced Intervention: REVIEW PLEASE SKILLS (TREAT PHYSICAL ILLNESS, BALANCE EATING, AVOID MOOD-ALTERING SUBSTANCES, BALANCE SLEEP AND GET EXERCISE) WITH [name] Intervention: Educate patient on: Stress management   Summary: Julie Harrison is a 70 y.o. female who  presents with improving symptoms related to bipolar disorder. Pt reports stable mood and compliance with medication.Pt reports that she is trying hard to manage situational stressors. Pt continuing to have occasional insomnia.   Allowed pt to explore and express thoughts and feelings associated with recent life situations and external stressors. Pt reports that she is continuing to  experience some racing thoughts at night. Pt reports that she had some stress associated with a recent illness her husband was experiencing but he is better now. Discussed how pt feels low motivation and initiative--especially when it gets darker earlier. Reviewed some ways that pt can get up earlier and have more exposure to morning sun. Encouraged "to do" list. Pt having some concerns about husband's son relapsing on meth. Pt reports that he has been exhibiting some bizarre behaviors in public that are embarrassing to himself and family members. Discussed overall psychological impact of the family member's relapse. Pt shared thoughts about extended family members--adopted daughter/grandchildren.  Continued recommendations are as follows: self care behaviors, positive social engagements, focusing on overall work/home/life balance, and focusing on positive physical and emotional wellness.   Suicidal/Homicidal: No  Therapist Response: Pt is continuing to apply interventions learned in session into daily life situations. Pt is currently on track to meet goals utilizing interventions mentioned above. Personal growth and progress noted. Treatment to continue as indicated.   Plan: Return again in 4 weeks.  Diagnosis: Axis I: Bipolar disorder, remission; GAD    Axis II: No diagnosis    [name] Dameisha Tschida, LCSW 08/22/2021

## 2021-09-13 ENCOUNTER — Other Ambulatory Visit: Payer: Self-pay | Admitting: Gastroenterology

## 2021-09-13 ENCOUNTER — Other Ambulatory Visit: Payer: Self-pay

## 2021-09-13 MED ORDER — ZENPEP 40000-126000 UNITS PO CPEP: ORAL_CAPSULE | ORAL | 1 refills | Status: DC

## 2021-09-13 NOTE — Progress Notes (Signed)
Sent in refill as requested 

## 2021-09-15 ENCOUNTER — Encounter: Payer: Self-pay | Admitting: Gastroenterology

## 2021-09-26 ENCOUNTER — Ambulatory Visit: Payer: Medicare Other | Admitting: Licensed Clinical Social Worker

## 2021-09-27 ENCOUNTER — Ambulatory Visit: Payer: Medicare Other | Admitting: Psychiatry

## 2021-10-23 ENCOUNTER — Ambulatory Visit (INDEPENDENT_AMBULATORY_CARE_PROVIDER_SITE_OTHER): Payer: Medicare Other | Admitting: Gastroenterology

## 2021-10-23 ENCOUNTER — Other Ambulatory Visit: Payer: Self-pay

## 2021-10-23 ENCOUNTER — Encounter: Payer: Self-pay | Admitting: Gastroenterology

## 2021-10-23 VITALS — BP 152/75 | HR 80 | Temp 98.1°F | Ht 59.0 in | Wt 123.4 lb

## 2021-10-23 DIAGNOSIS — K8681 Exocrine pancreatic insufficiency: Secondary | ICD-10-CM | POA: Diagnosis not present

## 2021-10-23 DIAGNOSIS — K580 Irritable bowel syndrome with diarrhea: Secondary | ICD-10-CM

## 2021-10-23 MED ORDER — RIFAXIMIN 550 MG PO TABS: 550.0000 mg | ORAL_TABLET | Freq: Three times a day (TID) | ORAL | 0 refills | Status: AC

## 2021-10-23 NOTE — Progress Notes (Signed)
Cephas Darby, MD [address]  [place]  [place], [place] [address]  Main: [phone]  Fax: [phone]    Gastroenterology Consultation  Referring Provider:     Gladstone Lighter, MD Primary Care Physician:  Gladstone Lighter, MD Primary Gastroenterologist:  Dr. Cephas Darby Reason for Consultation:    Exocrine pancreatic insufficiency       HPI:   Julie Harrison is a 71 y.o. female referred by Dr. Gladstone Lighter, MD  for consultation & management of abdominal cramps and diarrhea.  Patient has several years history of postprandial urgency with loose stools associated with abdominal cramps, bloating.  She was originally evaluated by Dr. Alice Reichert at [facility], underwent upper endoscopy as well as colonoscopy which were unremarkable except for mild erosive esophagitis.  Random colon biopsies were normal.  Patient felt like her symptoms were not addressed and she followed up at [facility].  She underwent sits marker study at [facility] which revealed moderate stool burden and retention of sitz markers.  Subsequently, she was recommended to undergo anorectal manometry and she did not pursue it at that time.  Patient does have history of anxiety and she notices anxiety triggers her GI symptoms.  She does drink sodas, sweet tea regularly as well as consumes red meat few times a week.  Her weight has been stable.  Her labs including CBC, CMP were unremarkable, normal HbA1c.  She does have history of hypothyroidism, currently on thyroid replacement therapy  Follow-up video visit 05/10/2021 I saw Ms. Julie Harrison end of May for chronic diarrhea and cramps after she underwent work-up by Dr. Alice Reichert.  I checked her pancreatic fecal elastase levels which confirmed moderate pancreatic insufficiency.  Her levels were 128.  I recommended Zenpep 40 K 2 capsules with each meal and 1 with snack.  Patient had several questions regarding the new diagnosis and therefore we have arranged MyChart video  visit. Patient has not started Zenpep yet.  Patient did not get the CT scan done yet  Patient does not smoke or drink alcohol Ex tobacco use  Follow-up visit 06/20/2021 Patient is here for follow-up of new diagnosis of EPI.  Her pancreatic fecal elastase levels were 128.  She has started Zenpep 2 capsules with each meal and 1 with snack.  She reports significant improvement in abdominal cramps and bloating.  She is not experiencing diarrhea.  She has regular bowel movement every 2 days without any significant straining or hard stools.  Her weight has been stable.  Follow-up visit 10/23/2021 Patient is here for follow-up of exocrine pancreatic insufficiency.  Patient is concerned about ongoing GI symptoms, she has been experiencing intermittent episodes of postprandial cramps, bloating and nonbloody loose stools that occur about 1 to 2 days weekly.  She is concerned about postprandial abdominal cramps that last despite having a bowel movement.  Her weight has been stable.  She did notice that when she limits dairy products, her symptoms are somewhat better.  She continues to take Zenpep 1 to 2 capsules with each meal and 1 with snack.  She also noticed that when she avoids beef, her symptoms improve.  She would like to know if she has any food intolerance/allergy.  Her symptoms are affecting her going out.  Patient does have history of generalized anxiety disorder for which she takes Wellbutrin and Prozac, followed by her psychiatrist.  She is supposed to follow-up with psychologist for therapy sessions which has not lately.  She has been on these  medications for last 2 years.  She also noticed that whenever she is anxious, her GI symptoms get worse  NSAIDs: None  Antiplts/Anticoagulants/Anti thrombotics: None  GI Procedures:  -Colonoscopy:08/05/2017 - Normal random colon bx, TA polyp x 1, diverticulosis, internal hemorroids - EGD: 08/05/2017 - Gastritis, no h pylori, LA Grade A esophagitis  Sits  marker study at [facility] 12/16/2019 FINDINGS:  Thirty-one Sitz markers are noted throughout the colon. The distal  most marker is likely at the level of the distal sigmoid colon. The  bowel gas pattern is nonobstructive. There is a moderate amount of  stool throughout the colon. There are degenerative changes of the  visualized thoracolumbar spine and bilateral hips, left worse than  right.   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 No pertinent surgical history.  Current Outpatient Medications:    buPROPion (WELLBUTRIN XL) 300 MG 24 hr tablet, Take 1 tablet (300 mg total) by mouth daily., Disp: 90 tablet, Rfl: 1   Calcium Carb-Cholecalciferol (OYSTER SHELL CALCIUM) 500-400 MG-UNIT TABS, Take by mouth., Disp: , Rfl:    Cholecalciferol 10 MCG (400 UNIT) CHEW, Chew by mouth., Disp: , Rfl:    ezetimibe (ZETIA) 10 MG tablet, Take 1 tablet by mouth daily., Disp: , Rfl:    FLUoxetine (PROZAC) 10 MG capsule, Take 1 capsule (10 mg total) by mouth daily with breakfast., Disp: 90 capsule, Rfl: 1   levothyroxine (SYNTHROID) 50 MCG tablet, Take by mouth., Disp: , Rfl:    lisinopril (ZESTRIL) 10 MG tablet, Take 10 mg by mouth daily., Disp: , Rfl:    loratadine (CLARITIN) 10 MG tablet, Take by mouth., Disp: , Rfl:    Multiple Vitamin (MULTI-VITAMIN) tablet, Take 1 tablet by mouth daily., Disp: , Rfl:    pantoprazole (PROTONIX) 40 MG tablet, Take 40 mg by mouth daily., Disp: , Rfl:    rifaximin (XIFAXAN) 550 MG TABS tablet, Take 1 tablet (550 mg total) by mouth 3 (three) times daily for 14 days., Disp: 42 tablet, Rfl: 0   simvastatin (ZOCOR) 20 MG tablet, Take by mouth., Disp: , Rfl:    ZENPEP 40000-126000 units CPEP, TAKE 2 CAPSULES WITH THE FIRST BITE OF EACH MEAL AND 1 CAPSULE  WITH THE FIRST BITE OF EACH SNACK, Disp: 300 capsule, Rfl: 1   Omega-3 Fatty Acids (FISH OIL) 1000 MG CAPS, Take by mouth. (Patient not taking: Reported on 10/23/2021), Disp: , Rfl:    Family History  Problem Relation Age of Onset   Depression Mother    Alcohol abuse Father    Alcohol abuse Paternal Uncle    Depression Maternal Grandmother    Breast cancer Maternal Grandmother 64   Breast cancer Maternal Aunt 48     Social History   Tobacco Use   Smoking status: Former    Packs/day: 0.25    Types: Cigarettes    Quit date: 03/22/2018    Years since quitting: 3.5   Smokeless tobacco: Never  Vaping Use   Vaping Use: Former  Substance Use Topics   Alcohol use: Yes    Alcohol/week: 2.0 standard drinks    Types: 2 Glasses of wine per week   Drug use: Yes    Types: Marijuana    Comment: about  a month ago    Allergies as of 10/23/2021 - Review Complete 10/23/2021  Allergen Reaction Noted   Hydralazine hcl Itching 07/12/2017   Erythromycin Nausea Only 11/08/2016   Hydralazine Itching 07/12/2017   Hydrochlorothiazide Itching 11/08/2016   Vortioxetine Nausea Only 11/08/2016    Review of Systems:    All systems reviewed and negative except where noted in HPI.   Physical Exam:  BP (!) 152/75 (BP Location: Left Arm, Patient Position: Sitting, Cuff Size: Normal)    Pulse 80    Temp 98.1 F (36.7 C) (Oral)    Ht 4\' 11"  (1.499 m)    Wt 123 lb 6 oz (56 kg)    BMI 24.92 kg/m  No LMP recorded. Patient is postmenopausal.  General:   Alert,  Well-developed, well-nourished, pleasant and cooperative in NAD Head:  Normocephalic and atraumatic. Eyes:  Sclera clear, no icterus.   Conjunctiva pink. Ears:  Normal auditory acuity. Nose:  No deformity, discharge, or lesions. Mouth:  No deformity or lesions,oropharynx pink & moist. Neck:  Supple; no masses or thyromegaly. Lungs:  Respirations even and unlabored.  Clear throughout to auscultation.   No wheezes, crackles, or rhonchi. No acute  distress. Heart:  Regular rate and rhythm; no murmurs, clicks, rubs, or gallops. Abdomen:  Normal bowel sounds. Soft, non-tender and nondistended without masses, hepatosplenomegaly or hernias noted.  No guarding or rebound tenderness.   Rectal: Not performed Msk:  Symmetrical without gross deformities. Good, equal movement & strength bilaterally. Pulses:  Normal pulses noted. Extremities:  No clubbing or edema.  No cyanosis. Neurologic:  Alert and oriented x3;  grossly normal neurologically. Skin:  Intact without significant lesions or rashes. No jaundice. Psych:  Alert and cooperative. Normal mood and affect.  Imaging Studies: Reviewed  Assessment and Plan:   Zariel Croan is a 71 y.o. pleasant Caucasian female with history of anxiety, depression, history of hypothyroidism, hypertension, history of tobacco use on Wellbutrin is seen in consultation for chronic symptoms of abdominal bloating, abdominal cramps as well as postprandial urgency, loose stools without rectal bleeding. EGD and colonoscopy were unremarkable, no evidence of H. pylori, random colon biopsies were normal.  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  Patient continues to have ongoing symptoms of postprandial abdominal cramps, bloating and nonbloody loose stools despite being on pancreatic enzyme replacement  Continue Zenpep 40K 1 to 2 capsules with each meal and 1 with snack CT abdomen and pelvis with contrast revealed mildly atrophic pancreas, otherwise unremarkable Check food allergy profile, alpha gal panel Trial of lactobacillus probiotics, [facility] samples provided.  If this does not help, recommend empiric trial of Xifaxan 550 mg 3 times daily for 2 weeks.  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  Advised patient to discuss with her psychiatrist if she can be switched to amitriptyline instead of fluoxetine    Follow up in 6 months   Cephas Darby, MD

## 2021-10-23 NOTE — Patient Instructions (Signed)
Gave [facility] samples

## 2021-10-25 ENCOUNTER — Encounter: Payer: Self-pay | Admitting: Gastroenterology

## 2021-10-25 LAB — FOOD ALLERGY PROFILE
Allergen Corn, IgE: <0.1 kU/L
Clam IgE: <0.1 kU/L
Codfish IgE: <0.1 kU/L
Egg White IgE: <0.1 kU/L
Milk IgE: 0.34 kU/L — AB
Peanut IgE: <0.1 kU/L
Scallop IgE: <0.1 kU/L
Sesame Seed IgE: <0.1 kU/L
Shrimp IgE: <0.1 kU/L
Soybean IgE: <0.1 kU/L
Walnut IgE: <0.1 kU/L
Wheat IgE: <0.1 kU/L

## 2021-10-25 LAB — ALPHA-GAL PANEL
Allergen Lamb IgE: 0.86 kU/L — AB
Beef IgE: 1.08 kU/L — AB
IgE (Immunoglobulin E), Serum: 38 IU/mL (ref 6–495)
O215-IgE Alpha-Gal: 1.63 kU/L — AB
Pork IgE: 0.58 kU/L — AB

## 2021-11-02 DIAGNOSIS — I6523 Occlusion and stenosis of bilateral carotid arteries: Secondary | ICD-10-CM | POA: Insufficient documentation

## 2021-11-21 ENCOUNTER — Ambulatory Visit (INDEPENDENT_AMBULATORY_CARE_PROVIDER_SITE_OTHER): Payer: Medicare Other | Admitting: Psychiatry

## 2021-11-21 ENCOUNTER — Encounter: Payer: Self-pay | Admitting: Psychiatry

## 2021-11-21 ENCOUNTER — Other Ambulatory Visit: Payer: Self-pay

## 2021-11-21 VITALS — BP 127/66 | HR 81 | Temp 97.8°F | Wt 122.4 lb

## 2021-11-21 DIAGNOSIS — F5101 Primary insomnia: Secondary | ICD-10-CM | POA: Diagnosis not present

## 2021-11-21 DIAGNOSIS — F411 Generalized anxiety disorder: Secondary | ICD-10-CM

## 2021-11-21 DIAGNOSIS — F3176 Bipolar disorder, in full remission, most recent episode depressed: Secondary | ICD-10-CM

## 2021-11-21 DIAGNOSIS — F4010 Social phobia, unspecified: Secondary | ICD-10-CM

## 2021-11-21 MED ORDER — TRAZODONE HCL 50 MG PO TABS: 50.0000 mg | ORAL_TABLET | Freq: Every evening | ORAL | 0 refills | Status: DC | PRN

## 2021-11-21 NOTE — Progress Notes (Signed)
BH MD OP Progress Note  11/21/2021 2:06 PM Julie Harrison  MRN:  [id number]  Chief Complaint:  Chief Complaint   Follow-up 71 year old Caucasian female with history of bipolar disorder, social anxiety disorder, GAD, presented for medication management.    HPI: Julie Harrison is a 71 year old Caucasian female, married, retired, lives in [place], has a history of bipolar disorder, GAD, social anxiety disorder, hypothyroidism, hypertension was evaluated in office today.  Patient today reports she continues to have psychosocial stressors.  It was the death anniversary of her daughter in January.  She however reports she did not even remember that majority of the day and believes she may have come to that stage of acceptance.  Patient however currently struggles with multiple health problems.  Recently was evaluated by GI-Dr. Santo Held 10/23/2021-diagnosed with exocrine pancreatic insufficiency, completed a course of antibiotics as well as is currently on Zenpep.  Patient reports her GI symptoms as improving at this time.  Unknown if she does have IBS, a possibility was discussed with her per her GI specialist.  Wonders whether amitriptyline will be a good antidepressant for her.  She is currently on Prozac and Wellbutrin.  Does have some sadness, sleep problems on and off.  She reports she is noncompliant with the trazodone however is agreeable to going back on it.  She is currently watching her diet.  Reports she currently is managing her anxiety symptoms better.   Has upcoming appointment with therapist.  Denies any suicidality, homicidality or perceptual disturbances.    Visit Diagnosis:    ICD-10-CM   1. Bipolar disorder, in full remission, most recent episode depressed (HCC)  F31.76     2. GAD (generalized anxiety disorder)  F41.1     3. Social anxiety disorder  F40.10     4. Primary insomnia  F51.01       Past Psychiatric History: Reviewed past psychiatric history from progress  note on 03/04/2018.  Past trials of Viibryd, duloxetine, Pristiq, Zoloft, Lexapro, Latuda, Prozac, brintellix.  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 No pertinent surgical history.  Family Psychiatric History: Reviewed family psychiatric history from progress note on 03/04/2018.  Family History:  Family History  Problem Relation Age of Onset   Depression Mother    Alcohol abuse Father    Alcohol abuse Paternal Uncle    Depression Maternal Grandmother    Breast cancer Maternal Grandmother 66   Breast cancer Maternal Aunt 72    Social History: Reviewed social history from progress note on 03/04/2018. Social History   Socioeconomic History   Marital status: Married    Spouse name: Not on file   Number of children: 2   Years of education: Not on file   Highest education level: Associate degree: occupational, Scientist, product/process development, or vocational program  Occupational History    Comment: retired  Tobacco Use   Smoking status: Former    Packs/day: 0.25    Types: Cigarettes    Quit date: 03/22/2018    Years since quitting: 3.6   Smokeless tobacco: Never  Vaping Use   Vaping Use: Former  Substance and Sexual Activity   Alcohol use: Yes    Alcohol/week: 2.0 standard drinks    Types:  2 Glasses of wine per week   Drug use: Yes    Types: Marijuana    Comment: about a month ago   Sexual activity: Yes  Other Topics Concern   Not on file  Social History Narrative   Not on file   Social Determinants of Health   Financial Resource Strain: Not on file  Food Insecurity: Not on file  Transportation Needs: Not on file  Physical Activity: Not on file  Stress: Not on file  Social Connections: Not on file    Allergies:  Allergies  Allergen  Reactions   Hydralazine Hcl Itching   Erythromycin Nausea Only   Hydralazine Itching   Hydrochlorothiazide Itching   Vortioxetine Nausea Only    Metabolic Disorder Labs: No results found for: HGBA1C, MPG No results found for: PROLACTIN No results found for: CHOL, TRIG, HDL, CHOLHDL, VLDL, LDLCALC No results found for: TSH  Therapeutic Level Labs: No results found for: LITHIUM No results found for: VALPROATE No components found for:  CBMZ  Current Medications: Current Outpatient Medications  Medication Sig Dispense Refill   buPROPion (WELLBUTRIN XL) 300 MG 24 hr tablet Take 1 tablet (300 mg total) by mouth daily. 90 tablet 1   Calcium Carb-Cholecalciferol (OYSTER SHELL CALCIUM) 500-400 MG-UNIT TABS Take by mouth.     Cholecalciferol 10 MCG (400 UNIT) CHEW Chew by mouth.     ezetimibe (ZETIA) 10 MG tablet Take 1 tablet by mouth daily.     FLUoxetine (PROZAC) 10 MG capsule Take 1 capsule (10 mg total) by mouth daily with breakfast. 90 capsule 1   levothyroxine (SYNTHROID) 50 MCG tablet Take 1 tablet by mouth daily.     lisinopril (ZESTRIL) 10 MG tablet Take 10 mg by mouth daily.     loratadine (CLARITIN) 10 MG tablet Take by mouth.     Multiple Vitamin (MULTI-VITAMIN) tablet Take 1 tablet by mouth daily.     Omega-3 Fatty Acids (FISH OIL) 1000 MG CAPS Take by mouth.     pantoprazole (PROTONIX) 40 MG tablet Take 40 mg by mouth daily.     simvastatin (ZOCOR) 20 MG tablet Take by mouth.     ZENPEP 40000-126000 units CPEP TAKE 2 CAPSULES WITH THE FIRST BITE OF EACH MEAL AND 1 CAPSULE WITH THE FIRST BITE OF EACH SNACK 300 capsule 1   rifaximin (XIFAXAN) 550 MG TABS tablet TAKE 1 TABLET BY MOUTH 3 TIMES DAILY FOR 14 DAYS. (Patient not taking: Reported on 11/21/2021)     No current facility-administered medications for this visit.     Musculoskeletal: Strength & Muscle Tone: within normal limits Gait & Station: normal Patient leans: N/A  Psychiatric Specialty Exam: Review of  Systems  Psychiatric/Behavioral:  Positive for sleep disturbance. The patient is nervous/anxious.   All other systems reviewed and are negative.  There were no vitals taken for this visit.There is no height or weight on file to calculate BMI.  General Appearance: Casual  Eye Contact:  Fair  Speech:  Clear and Coherent  Volume:  Normal  Mood:  Anxious  Affect:  Congruent  Thought Process:  Goal Directed and Descriptions of Associations: Intact  Orientation:  Full (Time, Place, and Person)  Thought Content: Logical   Suicidal Thoughts:  No  Homicidal Thoughts:  No  Memory:  Immediate;   Fair Recent;   Fair Remote;   Fair  Judgement:  Fair  Insight:  Fair  Psychomotor Activity:  Normal  Concentration:  Concentration: Fair and Attention Span: Fair  Recall:  Jennelle Human of Knowledge: Fair  Language: Fair  Akathisia:  No  Handed:  Right  AIMS (if indicated): done, 0  Assets:  Communication Skills Desire for Improvement Housing Intimacy Social Support  ADL's:  Intact  Cognition: WNL  Sleep:   poor   Screenings: GAD-7    Flowsheet Row Office Visit from 08/16/2021 in [facility]  Total GAD-7 Score 10      PHQ2-9    Flowsheet Row Office Visit from 11/21/2021 in [facility] Office Visit from 08/16/2021 in [facility] Counselor from 07/19/2021 in [facility] Video Visit from 11/24/2020 in [facility]  PHQ-2 Total Score 2 1 0 0  PHQ-9 Total Score 11 13 -- --      Flowsheet Row Counselor from 08/22/2021 in [facility] Office Visit from 08/16/2021 in [facility] Counselor from 07/19/2021 in [facility]  C-SSRS RISK CATEGORY No Risk No Risk No Risk        Assessment and Plan: Dnia Streight is a 71 year old Caucasian female who has a history of bipolar  disorder, GAD, hypothyroidism was evaluated in office today.  Patient with GI problems currently under the care of gastroenterologist,does report mood symptoms, sleep problems, will benefit from the following plan.  Plan  Bipolar disorder type II in remission Wellbutrin XL 300 mg p.o. daily Prozac 10 mg p.o. daily. We will consider adding a mood stabilizer in the future as needed.  Patient used to be on Lamictal however has been noncompliant.  GAD-stable Continue CBT with Ms. Christina Hussami Prozac 10 mg p.o. daily  Social anxiety disorder-stable Continue CBT  Primary insomnia-unstable Patient advised to take trazodone 50 mg p.o. nightly as needed for sleep. She has been noncompliant. We will consider adding Elavil, however will await for cardiology clearance.  Also discussed with patient if Elavil has to be added she may have to be taken off of the Prozac due to drug to drug interactions and she is also on Wellbutrin.  I reviewed notes per Dr. Allegra Lai dated 10/23/2021 as noted above.  Follow-up in clinic in 4 to 5 weeks or sooner if needed.  This note was generated in part or whole with voice recognition software. Voice recognition is usually quite accurate but there are transcription errors that can and very often do occur. I apologize for any typographical errors that were not detected and corrected.      Jomarie Longs, MD 11/21/2021, 2:06 PM

## 2021-12-01 ENCOUNTER — Ambulatory Visit (INDEPENDENT_AMBULATORY_CARE_PROVIDER_SITE_OTHER): Payer: Medicare Other | Admitting: Vascular Surgery

## 2021-12-12 ENCOUNTER — Other Ambulatory Visit: Payer: Self-pay | Admitting: Internal Medicine

## 2021-12-12 DIAGNOSIS — I6529 Occlusion and stenosis of unspecified carotid artery: Secondary | ICD-10-CM

## 2021-12-12 DIAGNOSIS — I6523 Occlusion and stenosis of bilateral carotid arteries: Secondary | ICD-10-CM

## 2021-12-12 DIAGNOSIS — I2510 Atherosclerotic heart disease of native coronary artery without angina pectoris: Secondary | ICD-10-CM

## 2021-12-13 ENCOUNTER — Other Ambulatory Visit: Payer: Self-pay

## 2021-12-13 ENCOUNTER — Ambulatory Visit (INDEPENDENT_AMBULATORY_CARE_PROVIDER_SITE_OTHER): Payer: Medicare Other | Admitting: Licensed Clinical Social Worker

## 2021-12-13 DIAGNOSIS — F411 Generalized anxiety disorder: Secondary | ICD-10-CM

## 2021-12-13 DIAGNOSIS — F3176 Bipolar disorder, in full remission, most recent episode depressed: Secondary | ICD-10-CM

## 2021-12-13 NOTE — Plan of Care (Signed)
?  Problem: Alleviate depressive/manic symptoms and return to improved levels of effective functioning. ?Goal: LTG: Stabilize mood and increase goal-directed behavior: 3 out of 5 opportunities to do so ?Outcome: Progressing ?Goal: STG: Julie Harrison WILL IDENTIFY COGNITIVE PATTERNS AND BELIEFS THAT INTERFERE WITH THERAPY ?Outcome: Progressing ?Intervention: Encourage verbalization of feelings/concerns/expectations ?Intervention: Encourage verbalization of problems out of their control ?Intervention: Encourage family support ?Intervention: Encourage patient to identify triggers ?Intervention: Assist with relaxation techniques, as appropriate (deep breathing exercises, meditation, guided imagery) ?  ?

## 2021-12-13 NOTE — Progress Notes (Signed)
? ?  THERAPIST PROGRESS NOTE ? ?Session Time: 11a-12p ? ?Participation Level: Active ? ?Behavioral Response: Neat and Well GroomedAlertAnxious ? ?Type of Therapy: Individual Therapy ? ?Treatment Goals addressed: Problem: Alleviate depressive/manic symptoms and return to improved levels of effective functioning. ?Goal: LTG: Stabilize mood and increase goal-directed behavior: 3 out of 5 opportunities to do so ?Outcome: Progressing ?Goal: STG: [name] WILL IDENTIFY COGNITIVE PATTERNS AND BELIEFS THAT INTERFERE WITH THERAPY ?Outcome: Progressing ? ?ProgressTowards Goals: Progressing ? ?Interventions: Solution Focused ? ?Summary: Julie Harrison is a 71 y.o. female who presents with improving symptoms related to overall mood and anxiety.  Pt reports that she is managing overall mood well and that she is having situational triggers . Pt reports fluctuating quality and quantity of sleep. Pt reports that she is working closely with her psychiatrist to manage symptoms.  ? ?Allowed pt to explore and express thoughts and feelings associated with recent life situations and external stressors.Discussed pts relationships with husband and mother.  Pt is currently drafting a cookbook with her mother with family recipes. Pt feels this is a good bonding activity with her mother and allows her some "self care" time away from the house. Discussed relationship with husband and how he is comfortable being with patient all the time. Encouraged self care activities to assist with stress management. Discussed pts relationship with her daughter and spending time with grandchildren.  ? ?Continued recommendations are as follows: self care behaviors, positive social engagements, focusing on overall work/home/life balance, and focusing on positive physical and emotional wellness.  ? ?Suicidal/Homicidal: No ? ?Therapist Response: Pt is continuing to apply interventions learned in session into daily life situations. Pt is currently on track to meet  goals utilizing interventions mentioned above. Personal growth and progress noted. Treatment to continue as indicated.  ? ?Plan: Return again in 4 weeks. ? ?Diagnosis: Bipolar disorder, in full remission, most recent episode depressed (HCC) ? ?GAD (generalized anxiety disorder) ? ?Collaboration of Care: Other Pt encouraged to continue follow ups with psychiatrist of record, Dr. Elna Breslow ? ?Patient/Guardian was advised Release of Information must be obtained prior to any record release in order to collaborate their care with an outside provider. Patient/Guardian was advised if they have not already done so to contact the registration department to sign all necessary forms in order for [place] to release information regarding their care.  ? ?Consent: Patient/Guardian gives verbal consent for treatment and assignment of benefits for services provided during this visit. Patient/Guardian expressed understanding and agreed to proceed.  ? ?Yeison Sippel R Pape Parson, LCSW ?12/13/2021 ? ?

## 2021-12-18 ENCOUNTER — Other Ambulatory Visit: Payer: Self-pay | Admitting: Gastroenterology

## 2021-12-19 ENCOUNTER — Other Ambulatory Visit: Payer: Self-pay | Admitting: Gastroenterology

## 2021-12-21 ENCOUNTER — Other Ambulatory Visit (INDEPENDENT_AMBULATORY_CARE_PROVIDER_SITE_OTHER): Payer: Self-pay | Admitting: Vascular Surgery

## 2021-12-21 DIAGNOSIS — I739 Peripheral vascular disease, unspecified: Secondary | ICD-10-CM

## 2021-12-22 ENCOUNTER — Encounter (INDEPENDENT_AMBULATORY_CARE_PROVIDER_SITE_OTHER): Payer: Self-pay | Admitting: Vascular Surgery

## 2021-12-22 ENCOUNTER — Ambulatory Visit (INDEPENDENT_AMBULATORY_CARE_PROVIDER_SITE_OTHER): Payer: Medicare Other | Admitting: Vascular Surgery

## 2021-12-22 ENCOUNTER — Ambulatory Visit
Admission: RE | Admit: 2021-12-22 | Discharge: 2021-12-22 | Disposition: A | Discharge status: Home / Self Care | Payer: Medicare Other | Source: Ambulatory Visit | Attending: Internal Medicine | Admitting: Internal Medicine

## 2021-12-22 ENCOUNTER — Other Ambulatory Visit: Payer: Self-pay

## 2021-12-22 ENCOUNTER — Ambulatory Visit (INDEPENDENT_AMBULATORY_CARE_PROVIDER_SITE_OTHER): Payer: Medicare Other

## 2021-12-22 VITALS — BP 146/78 | HR 68 | Resp 16 | Ht 59.0 in | Wt 122.0 lb

## 2021-12-22 DIAGNOSIS — I2510 Atherosclerotic heart disease of native coronary artery without angina pectoris: Secondary | ICD-10-CM | POA: Diagnosis present

## 2021-12-22 DIAGNOSIS — I6529 Occlusion and stenosis of unspecified carotid artery: Secondary | ICD-10-CM | POA: Diagnosis present

## 2021-12-22 DIAGNOSIS — I7300 Raynaud's syndrome without gangrene: Secondary | ICD-10-CM

## 2021-12-22 DIAGNOSIS — I10 Essential (primary) hypertension: Secondary | ICD-10-CM

## 2021-12-22 DIAGNOSIS — I739 Peripheral vascular disease, unspecified: Secondary | ICD-10-CM

## 2021-12-22 DIAGNOSIS — I6523 Occlusion and stenosis of bilateral carotid arteries: Secondary | ICD-10-CM | POA: Insufficient documentation

## 2021-12-22 LAB — POCT I-STAT CREATININE: Creatinine, Ser: 0.8 mg/dL (ref 0.44–1.00)

## 2021-12-22 MED ORDER — IOHEXOL 350 MG/ML SOLN
75.0000 mL | Freq: Once | INTRAVENOUS | Status: AC | PRN
  Administered 2021-12-22: 75 mL via INTRAVENOUS

## 2021-12-22 NOTE — Progress Notes (Signed)
MRN : [id number]  Julie InglesCynthia Harrison is a 71 y.o. (Sep 03, 1951) female who presents with chief complaint of  Chief Complaint  Patient presents with   Follow-up    ultrasound  .  History of Present Illness: Patient returns today in follow up of her Raynaud's disease but also has been recently diagnosed with significant carotid disease.  She had a duplex performed by her cardiologist which suggested at least moderate if not high-grade stenosis of her right carotid artery.  She has not had any focal neurologic symptoms. Specifically, the patient denies amaurosis fugax, speech or swallowing difficulties, or arm or leg weakness or numbness.  She is scheduled for a CT angiogram later today. As for her Raynaud's disease, she has been having painful burning toe tips and discoloration with some scaling skin on the tips of her toes.  Otherwise, no lifestyle limiting claudication, open wounds, or infection.  ABIs today are normal at 1.09 on the right and 1.08 on the left with normal digital pressures.  Current Outpatient Medications  Medication Sig Dispense Refill   buPROPion (WELLBUTRIN XL) 300 MG 24 hr tablet Take 1 tablet (300 mg total) by mouth daily. 90 tablet 1   Calcium Carb-Cholecalciferol (OYSTER SHELL CALCIUM) 500-400 MG-UNIT TABS Take by mouth.     Cholecalciferol 10 MCG (400 UNIT) CHEW Chew by mouth.     ezetimibe (ZETIA) 10 MG tablet Take 1 tablet by mouth daily.     FLUoxetine (PROZAC) 10 MG capsule Take 1 capsule (10 mg total) by mouth daily with breakfast. 90 capsule 1   levothyroxine (SYNTHROID) 50 MCG tablet Take 1 tablet by mouth daily.     lisinopril (ZESTRIL) 10 MG tablet Take 10 mg by mouth daily.     loratadine (CLARITIN) 10 MG tablet Take by mouth.     Multiple Vitamin (MULTI-VITAMIN) tablet Take 1 tablet by mouth daily.     Omega-3 Fatty Acids (FISH OIL) 1000 MG CAPS Take by mouth.     Pancrelipase, Lip-Prot-Amyl, (ZENPEP) 40000-126000 units CPEP TAKE 2 CAPSULES WITH THE FIRST  BITE OF EACH MEAL AND 1 CAPSULE WITH THE FIRST BITE OF EACH SNACK 300 capsule 4   pantoprazole (PROTONIX) 40 MG tablet Take 40 mg by mouth daily.     simvastatin (ZOCOR) 20 MG tablet Take by mouth.     rifaximin (XIFAXAN) 550 MG TABS tablet TAKE 1 TABLET BY MOUTH 3 TIMES DAILY FOR 14 DAYS. (Patient not taking: Reported on 11/21/2021)     traZODone (DESYREL) 50 MG tablet Take 1 tablet (50 mg total) by mouth at bedtime as needed for sleep. (Patient not taking: Reported on 12/22/2021) 30 tablet 0   No current facility-administered medications for this visit.    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   Social History   Tobacco Use   Smoking status: Former    Packs/day: 0.25    Types: Cigarettes    Quit date: 03/22/2018    Years since quitting: 3.7   Smokeless tobacco: Never  Vaping Use   Vaping Use: Former  Substance Use Topics   Alcohol use: Yes    Alcohol/week: 2.0 standard drinks    Types: 2 Glasses of wine per  week   Drug use: Yes    Types: Marijuana    Comment: about a month ago      Family History  Problem Relation Age of Onset   Depression Mother    Alcohol abuse Father    Alcohol abuse Paternal Uncle    Depression Maternal Grandmother    Breast cancer Maternal Grandmother 65   Breast cancer Maternal Aunt 50     Allergies  Allergen Reactions   Hydralazine Hcl Itching   Erythromycin Nausea Only   Hydralazine Itching   Hydrochlorothiazide Itching   Vortioxetine Nausea Only     REVIEW OF SYSTEMS (Negative unless checked)  Constitutional: [] Weight loss  [] Fever  [] Chills Cardiac: [] Chest pain   [] Chest pressure   [] Palpitations   [] Shortness of breath when laying flat   [] Shortness of breath at rest   [] Shortness  of breath with exertion. Vascular:  [] Pain in legs with walking   [] Pain in legs at rest   [] Pain in legs when laying flat   [] Claudication   [] Pain in feet when walking  [] Pain in feet at rest  [] Pain in feet when laying flat   [] History of DVT   [] Phlebitis   [] Swelling in legs   [] Varicose veins   [] Non-healing ulcers Pulmonary:   [] Uses home oxygen   [] Productive cough   [] Hemoptysis   [] Wheeze  [] COPD   [] Asthma Neurologic:  [] Dizziness  [] Blackouts   [] Seizures   [] History of stroke   [] History of TIA  [] Aphasia   [] Temporary blindness   [] Dysphagia   [] Weakness or numbness in arms   [] Weakness or numbness in legs Musculoskeletal:  [x] Arthritis   [] Joint swelling   [x] Joint pain   [] Low back pain Hematologic:  [] Easy bruising  [] Easy bleeding   [] Hypercoagulable state   [] Anemic   Gastrointestinal:  [] Blood in stool   [] Vomiting blood  [] Gastroesophageal reflux/heartburn   [] Abdominal pain Genitourinary:  [] Chronic kidney disease   [] Difficult urination  [] Frequent urination  [] Burning with urination   [] Hematuria Skin:  [] Rashes   [] Ulcers   [] Wounds Psychological:  [] History of anxiety   []  History of major depression.  Physical Examination  BP (!) 146/78 (BP Location: Left Arm)    Pulse 68    Resp 16    Ht 4\' 11"  (1.499 m)    Wt 122 lb (55.3 kg)    BMI 24.64 kg/m  Gen:  WD/WN, NAD Head: [place]/AT, No temporalis wasting. Ear/Nose/Throat: Hearing grossly intact, nares w/o erythema or drainage Eyes: Conjunctiva clear. Sclera non-icteric Neck: Supple.  Trachea midline Pulmonary:  Good air movement, no use of accessory muscles.  Cardiac: RRR, no JVD Vascular:  Vessel Right Left  Radial Palpable Palpable                          PT Palpable Palpable  DP Palpable Palpable   Gastrointestinal: soft, non-tender/non-distended. No guarding/reflex.  Musculoskeletal: M/S 5/5 throughout.  No deformity or atrophy. No edema. Neurologic: Sensation grossly intact in extremities.  Symmetrical.   Speech is fluent.  Psychiatric: Judgment intact, Mood & affect appropriate for pt's clinical situation. Dermatologic: No rashes or ulcers noted.  No cellulitis or open wounds.      Labs Recent Results (from the past 2160 hour(s))  Alpha-Gal Panel     Status: Abnormal   Collection Time: 10/23/21  2:09 PM  Result Value Ref Range   Class Description Allergens Comment     Comment:  Levels of Specific IgE       Class  Description of Class     ---------------------------  -----  --------------------                    < 0.10         0         Negative            0.10 -    0.31         0/I       Equivocal/Low            0.32 -    0.55         I         Low            0.56 -    1.40         II        Moderate            1.41 -    3.90         III       High            3.91 -   19.00         IV        Very High           19.01 -  100.00         V         Very High                   >100.00         VI        Very High    IgE (Immunoglobulin E), Serum 38 6 - 495 IU/mL   O215-IgE Alpha-Gal 1.63 (A) Class III kU/L   Beef IgE 1.08 (A) Class II kU/L   Pork IgE 0.58 (A) Class II kU/L   Allergen Lamb IgE 0.86 (A) Class II kU/L  Food Allergy Profile     Status: Abnormal   Collection Time: 10/23/21  2:09 PM  Result Value Ref Range   Egg White IgE <0.10 Class 0 kU/L   Peanut IgE <0.10 Class 0 kU/L   Soybean IgE <0.10 Class 0 kU/L   Milk IgE 0.34 (A) Class I kU/L   Clam IgE <0.10 Class 0 kU/L   Shrimp IgE <0.10 Class 0 kU/L   Walnut IgE <0.10 Class 0 kU/L   Codfish IgE <0.10 Class 0 kU/L   Scallop IgE <0.10 Class 0 kU/L   Wheat IgE <0.10 Class 0 kU/L   Allergen Corn, IgE <0.10 Class 0 kU/L   Sesame Seed IgE <0.10 Class 0 kU/L    Radiology No results found.  Assessment/Plan Benign essential HTN blood pressure control important in reducing the progression of atherosclerotic disease. On appropriate oral medications.     Hyperlipidemia, mixed lipid control important in reducing the  progression of atherosclerotic disease. Continue statin therapy  Bilateral carotid artery stenosis Recent duplex that suggested 70% or greater stenosis in the right carotid artery according to the patient.  This was not done in the [facility] system so I cannot view it at this time.  She is scheduled for a CT angiogram later today which I will review and help determine further treatment options.  Raynaud disease ABIs today are normal at 1.09 on the right and 1.08 on the left with normal digital  pressures.  No role for intervention or problems.  Discussed avoidance of cold stimulation.  Recheck in 1 year.    Festus Barren, MD  12/22/2021 9:21 AM    This note was created with Dragon medical transcription system.  Any errors from dictation are purely unintentional

## 2021-12-22 NOTE — Assessment & Plan Note (Signed)
ABIs today are normal at 1.09 on the right and 1.08 on the left with normal digital pressures.  No role for intervention or problems.  Discussed avoidance of cold stimulation.  Recheck in 1 year. ?

## 2021-12-22 NOTE — Assessment & Plan Note (Signed)
Recent duplex that suggested 70% or greater stenosis in the right carotid artery according to the patient.  This was not done in the [facility] system so I cannot view it at this time.  She is scheduled for a CT angiogram later today which I will review and help determine further treatment options. ?

## 2021-12-27 ENCOUNTER — Ambulatory Visit: Payer: Medicare Other | Admitting: Psychiatry

## 2022-01-09 NOTE — Telephone Encounter (Signed)
Message has been replied ?

## 2022-01-30 ENCOUNTER — Encounter: Payer: Self-pay | Admitting: Gastroenterology

## 2022-01-30 ENCOUNTER — Other Ambulatory Visit: Payer: Self-pay

## 2022-01-30 ENCOUNTER — Ambulatory Visit: Payer: Medicare Other | Admitting: Gastroenterology

## 2022-01-30 VITALS — BP 151/66 | HR 59 | Temp 97.7°F | Ht 59.0 in | Wt 122.1 lb

## 2022-01-30 DIAGNOSIS — K529 Noninfective gastroenteritis and colitis, unspecified: Secondary | ICD-10-CM

## 2022-01-30 DIAGNOSIS — R140 Abdominal distension (gaseous): Secondary | ICD-10-CM

## 2022-01-30 DIAGNOSIS — Z91018 Allergy to other foods: Secondary | ICD-10-CM

## 2022-01-30 NOTE — Progress Notes (Signed)
?  ?Arlyss Repressohini R Taseen Marasigan, MD ?[address]  ?[address]  ?[place], [place] [address]  ?Main: [phone]  ?Fax: [phone] ? ? ? ?Gastroenterology Consultation ? ?Referring Provider:     Enid BaasKalisetti, Radhika, MD ?Primary Care Physician:  Enid BaasKalisetti, Radhika, MD ?Primary Gastroenterologist:  Dr. Arlyss Repressohini R An Schnabel ?Reason for Consultation:   Chronic diarrhea, abdominal bloating    ? HPI:   ?Julie InglesCynthia Barcenas is a 71 y.o. female referred by Dr. Enid BaasKalisetti, Radhika, MD  for consultation & management of abdominal cramps and diarrhea.  Patient has several years history of postprandial urgency with loose stools associated with abdominal cramps, bloating.  She was originally evaluated by Dr. Norma [facility]edo at [facility], underwent upper endoscopy as well as colonoscopy which were unremarkable except for mild erosive esophagitis.  Random colon biopsies were normal.  Patient felt like her symptoms were not addressed and [facility]  [facility] sits [facility] which revealed moderate stool burden and retention of sitz markers.  Subsequently, she was recommended to undergo anorectal manometry and she did not pursue it at that time.  Patient does have history of anxiety and she notices anxiety triggers her GI symptoms.  She does drink sodas, sweet tea regularly as well as consumes red meat few times a week.  Her weight has been stable.  Her labs including CBC, CMP were unremarkable, normal HbA1c.  She does have history of hypothyroidism, currently on thyroid replacement therapy ? ?Follow-up video visit 05/10/2021 ?I saw Ms. Julie InglesCynthia Moster end of May for chronic diarrhea and cramps after she underwent work-up by Dr. Norma Fredricksonoledo.  I checked her pancreatic fecal elastase levels which confirmed moderate pancreatic insufficiency.  Her levels were 128.  I recommended Zenpep 40 K 2 capsules with each meal and 1 with snack.  Patient had several questions regarding the new diagnosis and therefore we have arranged MyChart video  visit. Patient has not started Zenpep yet.  Patient did not get the CT scan done yet ? ?Patient does not smoke or drink alcohol ?Ex tobacco use ? ?Follow-up visit 06/20/2021 ?Patient is here for follow-up of new diagnosis of EPI.  Her pancreatic fecal elastase levels were 128.  She has started Zenpep 2 capsules with each meal and 1 with snack.  She reports significant improvement in abdominal cramps and bloating.  She is not experiencing diarrhea.  She has regular bowel movement every 2 days without any significant straining or hard stools.  Her weight has been stable. ? ?Follow-up visit 10/23/2021 ?Patient is here for follow-up of exocrine pancreatic insufficiency.  Patient is concerned about ongoing GI symptoms, she has been experiencing intermittent episodes of postprandial cramps, bloating and nonbloody loose stools that occur about 1 to 2 days weekly.  She is concerned about postprandial abdominal cramps that last despite having a bowel movement.  Her weight has been stable.  She did notice that when she limits dairy products, her symptoms are somewhat better.  She continues to take Zenpep 1 to 2 capsules with each meal and 1 with snack.  She also noticed that when she avoids beef, her symptoms improve.  She would like to know if she has any food intolerance/allergy.  Her symptoms are affecting her going out.  Patient does have history of generalized anxiety disorder for which she takes Wellbutrin and Prozac, followed by her psychiatrist.  She is supposed to follow-up with psychologist for therapy sessions which has not lately.  She has been on these medications for  last 2 years.  She also noticed that whenever she is anxious, her GI symptoms get worse ? ?Follow-up visit 01/30/2022 ?Patient is here for follow-up of pancreatic insufficiency, chronic diarrhea and abdominal bloating.  Patient is currently on Zenpep 40 K 2 capsules before each meal and 1 with snack.  Since last visit, she reports that her symptoms of  diarrhea and bloating have significantly improved.  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  Patient has eliminated beef, pork and lamb and noticed significant improvement in her symptoms.  She also noticed that when she has a meal that eats cheese heavy, next day, she has worsening of symptoms.  She is questioning the diagnosis of EPI because she never felt significantly better on pancreatic enzymes.  She underwent CT abdomen pelvis with contrast which was unremarkable. ? ?NSAIDs: Harrison ? ?Antiplts/Anticoagulants/Anti thrombotics: Harrison ? ?GI Procedures:  ?-Colonoscopy:08/05/2017 - Normal random colon bx, TA polyp x 1, diverticulosis, internal hemorroids ?- EGD: 08/05/2017 - Gastritis, no h pylori, LA Grade A esophagitis ? ?Sits marker study at [facility] 12/16/2019 ?FINDINGS:  ?9 Sitz markers are noted throughout the colon. The distal  ?most marker is likely at the level of the distal sigmoid colon. The  ?bowel gas pattern is nonobstructive. There is a moderate amount of  ?stool throughout the colon. There are degenerative changes of the  ?visualized thoracolumbar spine and bilateral hips, left worse than  ?right. ? ? ?Past Medical History:  ?Diagnosis Date  ? Hypertension   ? 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 Personal history of colonic polyps   ? Thyroid disease   ? Tobacco dependence   ? Vitamin D deficiency   ? ? ?History reviewed. No pertinent surgical history. ? ?Current Outpatient Medications:  ?  buPROPion (WELLBUTRIN XL) 300 MG 24 hr tablet, Take 1 tablet (300 mg total) by mouth daily., Disp: 90 tablet, Rfl: 1 ?  Calcium Carb-Cholecalciferol (OYSTER SHELL CALCIUM) 500-400 MG-UNIT TABS, Take by mouth., Disp: , Rfl:  ?  Cholecalciferol 10 MCG (400 UNIT) CHEW, Chew by mouth., Disp: , Rfl:  ?  ezetimibe (ZETIA) 10 MG tablet, Take 1 tablet by mouth daily., Disp: , Rfl:  ?  fexofenadine (ALLEGRA) 180 MG tablet, Take 180 mg by mouth  daily., Disp: , Rfl:  ?  FLUoxetine (PROZAC) 10 MG capsule, Take 1 capsule (10 mg total) by mouth daily with breakfast., Disp: 90 capsule, Rfl: 1 ?  levothyroxine (SYNTHROID) 50 MCG tablet, Take 1 tablet by mouth daily., Disp: , Rfl:  ?  lisinopril (ZESTRIL) 20 MG tablet, Take 20 mg by mouth daily., Disp: , Rfl:  ?  Multiple Vitamin (MULTI-VITAMIN) tablet, Take 1 tablet by mouth daily., Disp: , Rfl:  ?  Omega-3 Fatty Acids (FISH OIL) 1000 MG CAPS, Take by mouth., Disp: , Rfl:  ?  Pancrelipase, Lip-Prot-Amyl, (ZENPEP) 40000-126000 units CPEP, TAKE 2 CAPSULES WITH THE FIRST BITE OF EACH MEAL AND 1 CAPSULE WITH THE FIRST BITE OF EACH SNACK, Disp: 300 capsule, Rfl: 4 ?  pantoprazole (PROTONIX) 40 MG tablet, Take 40 mg by mouth daily., Disp: , Rfl:  ?  simvastatin (ZOCOR) 20 MG tablet, Take by mouth., Disp: , Rfl:  ?  traZODone (DESYREL) 50 MG tablet, Take 1 tablet (50 mg total) by mouth at bedtime as needed for sleep., Disp: 30 tablet, Rfl: 0 ? ? ?Family History  ?Problem Relation Age of Onset  ? Depression Mother   ?  Alcohol abuse Father   ? Alcohol abuse Paternal Uncle   ? Depression Maternal Grandmother   ? Breast cancer Maternal Grandmother 50  ? Breast cancer Maternal Aunt 50  ?  ? ?Social History  ? ?Tobacco Use  ? Smoking status: Former  ?  Packs/day: 0.25  ?  Types: Cigarettes  ?  Quit date: 03/22/2018  ?  Years since quitting: 3.8  ? Smokeless tobacco: Never  ?Vaping Use  ? Vaping Use: Former  ?Substance Use Topics  ? Alcohol use: Yes  ?  Alcohol/week: 2.0 standard drinks  ?  Types: 2 Glasses of wine per week  ? Drug use: Yes  ?  Types: Marijuana  ?  Comment: about a month ago  ? ? ?Allergies as of 01/30/2022 - Review Complete 01/30/2022  ?Allergen Reaction Noted  ? Hydralazine hcl Itching 07/12/2017  ? Erythromycin Nausea Only 11/08/2016  ? Hydralazine Itching 07/12/2017  ? Hydrochlorothiazide Itching 11/08/2016  ? Vortioxetine Nausea Only 11/08/2016  ? ? ?Review of Systems:    ?All systems reviewed and negative  except where noted in HPI. ? ? Physical Exam:  ?BP (!) 151/66 (BP Location: Left Arm, Patient Position: Sitting, Cuff Size: Normal)   Pulse (!) 59   Temp 97.7 ?F (36.5 ?C) (Oral)   Ht 4\' 11"  (1.499 m)   Wt

## 2022-02-01 LAB — CELIAC DISEASE PANEL
Endomysial IgA: NEGATIVE
IgA/Immunoglobulin A, Serum: 305 mg/dL (ref 87–352)
Transglutaminase IgA: <2 U/mL (ref 0–3)

## 2022-02-05 ENCOUNTER — Ambulatory Visit: Payer: Medicare Other | Admitting: Psychiatry

## 2022-02-08 ENCOUNTER — Other Ambulatory Visit: Payer: Self-pay | Admitting: Psychiatry

## 2022-02-08 DIAGNOSIS — F411 Generalized anxiety disorder: Secondary | ICD-10-CM

## 2022-02-08 DIAGNOSIS — F3176 Bipolar disorder, in full remission, most recent episode depressed: Secondary | ICD-10-CM

## 2022-02-08 DIAGNOSIS — F4010 Social phobia, unspecified: Secondary | ICD-10-CM

## 2022-03-16 ENCOUNTER — Other Ambulatory Visit: Payer: Self-pay | Admitting: Internal Medicine

## 2022-03-16 DIAGNOSIS — Z1231 Encounter for screening mammogram for malignant neoplasm of breast: Secondary | ICD-10-CM

## 2022-03-19 ENCOUNTER — Ambulatory Visit: Payer: Self-pay | Admitting: Licensed Clinical Social Worker

## 2022-03-19 ENCOUNTER — Telehealth: Payer: Self-pay | Admitting: Licensed Clinical Social Worker

## 2022-03-19 DIAGNOSIS — Z91199 Patient's noncompliance with other medical treatment and regimen due to unspecified reason: Secondary | ICD-10-CM

## 2022-03-19 NOTE — Telephone Encounter (Signed)
LCSW counselor tried to connect with patient for scheduled appointment via MyChart video text request x 2 and email request; also tried to connect via phone without success. LCSW counselor left message for patient to call office number to reschedule OPT appointment.   Attempt 1: Text and email: 8:04am  Attempt 2:  Mychart phone call/left message: 8:06am   Attempt 3: phone call office number: 8:16am left message

## 2022-03-19 NOTE — Progress Notes (Signed)
LCSW counselor tried to connect with patient for scheduled appointment via MyChart video text request x 2 and email request; also tried to connect via phone without success. LCSW counselor left message for patient to call office number to reschedule OPT appointment.   Attempt 1: Text and email: 8:04am  Attempt 2:  Mychart phone call/left message: 8:06am   Attempt 3: phone call office number: 8:16am left message   

## 2022-04-10 ENCOUNTER — Ambulatory Visit
Admission: RE | Admit: 2022-04-10 | Discharge: 2022-04-10 | Disposition: A | Discharge status: Home / Self Care | Payer: Medicare Other | Source: Ambulatory Visit | Attending: Internal Medicine | Admitting: Internal Medicine

## 2022-04-10 ENCOUNTER — Encounter: Payer: Self-pay | Admitting: Psychiatry

## 2022-04-10 ENCOUNTER — Ambulatory Visit (INDEPENDENT_AMBULATORY_CARE_PROVIDER_SITE_OTHER): Payer: Medicare Other | Admitting: Psychiatry

## 2022-04-10 VITALS — BP 130/66 | HR 77 | Temp 97.8°F | Wt 119.8 lb

## 2022-04-10 DIAGNOSIS — F4010 Social phobia, unspecified: Secondary | ICD-10-CM

## 2022-04-10 DIAGNOSIS — F411 Generalized anxiety disorder: Secondary | ICD-10-CM | POA: Diagnosis not present

## 2022-04-10 DIAGNOSIS — F5101 Primary insomnia: Secondary | ICD-10-CM | POA: Diagnosis not present

## 2022-04-10 DIAGNOSIS — F3176 Bipolar disorder, in full remission, most recent episode depressed: Secondary | ICD-10-CM

## 2022-04-10 DIAGNOSIS — Z1231 Encounter for screening mammogram for malignant neoplasm of breast: Secondary | ICD-10-CM | POA: Diagnosis present

## 2022-04-10 MED ORDER — TRAZODONE HCL 50 MG PO TABS: 50.0000 mg | ORAL_TABLET | Freq: Every evening | ORAL | 0 refills | Status: DC | PRN

## 2022-04-10 NOTE — Progress Notes (Deleted)
BH MD/PA/NP OP Progress Note  04/10/2022 3:41 PM Julie Harrison  MRN:  [id number]  Chief Complaint:  Chief Complaint  Patient presents with   Follow-up   HPI: *** Visit Diagnosis: No diagnosis found.  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  Family Psychiatric History: ***  Family History:  Family History  Problem Relation Age of Onset   Depression Mother    Alcohol abuse Father    Alcohol abuse Paternal Uncle    Depression Maternal Grandmother    Breast cancer Maternal Grandmother 37   Breast cancer Maternal Aunt 18    Social History:  Social History   Socioeconomic History   Marital status: Married    Spouse name: Not on file   Number of children: 2   Years of education: Not on file   Highest education level: Associate degree: occupational, Scientist, product/process development, or vocational program  Occupational History    Comment: retired  Tobacco Use   Smoking status: Former    Packs/day: 0.25    Types: Cigarettes    Quit date: 03/22/2018    Years since quitting: 4.0   Smokeless tobacco: Never  Vaping Use   Vaping Use: Former  Substance and Sexual Activity   Alcohol use: Yes    Alcohol/week: 2.0 standard drinks of alcohol    Types: 2 Glasses of wine per week   Drug use: Yes    Types: Marijuana    Comment: about a month ago   Sexual activity: Yes  Other Topics Concern   Not on file  Social History Narrative   Not on file   Social Determinants of Health   Financial Resource Strain: Low Risk  (03/04/2018)   Overall Financial Resource Strain (CARDIA)    Difficulty of Paying Living Expenses: Not hard at all  Food Insecurity: No Food Insecurity (03/04/2018)   Hunger Vital Sign     Worried About Running Out of Food in the Last Year: Never true    Ran Out of Food in the Last Year: Never true  Transportation Needs: No Transportation Needs (03/04/2018)   PRAPARE - Administrator, Civil Service (Medical): No    Lack of Transportation (Non-Medical): No  Physical Activity: Inactive (03/04/2018)   Exercise Vital Sign    Days of Exercise per Week: 0 days    Minutes of Exercise per Session: 0 min  Stress: No Stress Concern Present (03/04/2018)   [employer] of Occupational Health - Occupational Stress Questionnaire    Feeling of Stress : Not at all  Social Connections: Somewhat Isolated (03/04/2018)   Social Connection and Isolation Panel [NHANES]    Frequency of Communication with Friends and Family: More than three times a week    Frequency of Social Gatherings with Friends and Family: Once a week    Attends Religious Services: More than 4 times per year    Active Member of [employer] or Organizations: No    Attends Banker Meetings: Never    Marital Status: Divorced    Allergies:  Allergies  Allergen Reactions   Hydralazine Hcl Itching   Erythromycin Nausea Only   Hydralazine Itching   Hydrochlorothiazide  Itching   Vortioxetine Nausea Only    Metabolic Disorder Labs: No results found for: "HGBA1C", "MPG" No results found for: "PROLACTIN" No results found for: "CHOL", "TRIG", "HDL", "CHOLHDL", "VLDL", "LDLCALC" No results found for: "TSH"  Therapeutic Level Labs: No results found for: "LITHIUM" No results found for: "VALPROATE" No results found for: "CBMZ"  Current Medications: Current Outpatient Medications  Medication Sig Dispense Refill   buPROPion (WELLBUTRIN XL) 300 MG 24 hr tablet TAKE 1 TABLET BY MOUTH EVERY DAY 90 tablet 1   Calcium Carb-Cholecalciferol (OYSTER SHELL CALCIUM) 500-400 MG-UNIT TABS Take by mouth.     Cholecalciferol 10 MCG (400 UNIT) CHEW Chew by mouth.     ezetimibe (ZETIA) 10 MG tablet Take 1 tablet by  mouth daily.     fexofenadine (ALLEGRA) 180 MG tablet Take 180 mg by mouth daily.     FLUoxetine (PROZAC) 10 MG capsule TAKE 1 CAPSULE (10 MG TOTAL) BY MOUTH DAILY WITH BREAKFAST. 90 capsule 1   levothyroxine (SYNTHROID) 50 MCG tablet Take 1 tablet by mouth daily.     lisinopril (ZESTRIL) 20 MG tablet Take 20 mg by mouth daily.     Multiple Vitamin (MULTI-VITAMIN) tablet Take 1 tablet by mouth daily.     Omega-3 Fatty Acids (FISH OIL) 1000 MG CAPS Take by mouth.     Pancrelipase, Lip-Prot-Amyl, (ZENPEP) 40000-126000 units CPEP TAKE 2 CAPSULES WITH THE FIRST BITE OF EACH MEAL AND 1 CAPSULE WITH THE FIRST BITE OF EACH SNACK 300 capsule 4   pantoprazole (PROTONIX) 40 MG tablet Take 40 mg by mouth daily.     simvastatin (ZOCOR) 20 MG tablet Take by mouth.     traZODone (DESYREL) 50 MG tablet Take 1 tablet (50 mg total) by mouth at bedtime as needed for sleep. 30 tablet 0   No current facility-administered medications for this visit.     Musculoskeletal: Strength & Muscle Tone: {desc; muscle tone:32375} Gait & Station: {PE GAIT ED JXBJ:47829} Patient leans: {Patient Leans:21022755}  Psychiatric Specialty Exam: Review of Systems  Blood pressure 130/66, pulse 77, temperature 97.8 F (36.6 C), temperature source Temporal, weight 119 lb 12.8 oz (54.3 kg).Body mass index is 24.2 kg/m.  General Appearance: {Appearance:22683}  Eye Contact:  {BHH EYE CONTACT:22684}  Speech:  {Speech:22685}  Volume:  {Volume (PAA):22686}  Mood:  {BHH MOOD:22306}  Affect:  {Affect (PAA):22687}  Thought Process:  {Thought Process (PAA):22688}  Orientation:  {BHH ORIENTATION (PAA):22689}  Thought Content: {Thought Content:22690}   Suicidal Thoughts:  {ST/HT (PAA):22692}  Homicidal Thoughts:  {ST/HT (PAA):22692}  Memory:  {BHH MEMORY:22881}  Judgement:  {Judgement (PAA):22694}  Insight:  {Insight (PAA):22695}  Psychomotor Activity:  {Psychomotor (PAA):22696}  Concentration:  {Concentration:21399}  Recall:   {BHH GOOD/FAIR/POOR:22877}  Fund of Knowledge: {BHH GOOD/FAIR/POOR:22877}  Language: {BHH GOOD/FAIR/POOR:22877}  Akathisia:  {BHH YES OR NO:22294}  Handed:  {Handed:22697}  AIMS (if indicated): {Desc; done/not:10129}  Assets:  {Assets (PAA):22698}  ADL's:  {BHH FAO'Z:30865}  Cognition: {chl bhh cognition:304700322}  Sleep:  {BHH GOOD/FAIR/POOR:22877}   Screenings: GAD-7    Flowsheet Row Office Visit from 08/16/2021 in [facility]  Total GAD-7 Score 10      PHQ2-9    Flowsheet Row Office Visit from 04/10/2022 in [facility] Office Visit from 11/21/2021 in [facility] Office Visit from 08/16/2021 in [facility] Counselor from 07/19/2021 in [facility] Video Visit from 11/24/2020 in [facility]  PHQ-2 Total Score 3 2 1  0 0  PHQ-9 Total Score 11 11 13  -- --      Flowsheet Row Office Visit from 04/10/2022 in [facility] Counselor from 08/22/2021 in [facility] Office Visit from 08/16/2021 in [facility]  C-SSRS RISK CATEGORY No Risk No Risk No Risk        Assessment and Plan: ***  Collaboration of Care: Collaboration of Care: [facility] OP Collaboration of KLKJ:[id number]}  Patient/Guardian was advised Release of Information must be obtained prior to any record release in order to collaborate their care with an outside provider. Patient/Guardian was advised if they have not already done so to contact the registration department to sign all necessary forms in order for [place] to release information regarding their care.   Consent: Patient/Guardian gives verbal consent for treatment and assignment of benefits for services provided during this visit. Patient/Guardian expressed understanding and agreed to proceed.    Jomarie Longs, MD 04/10/2022,  3:41 PM

## 2022-04-10 NOTE — Progress Notes (Signed)
BH MD OP Progress Note  04/10/2022 4:29 PM Julie Harrison  MRN:  [id number]  Chief Complaint:  Chief Complaint  Patient presents with   Follow-up: 71 year old Caucasian female with history of bipolar disorder, social anxiety disorder, GAD, presented for medication management.   HPI: Julie Harrison is a 71 year old Caucasian female, married, retired, lives in [place], has a history of bipolar disorder, GAD, social anxiety disorder, hypothyroidism, hypertension was evaluated in office today.  Patient today reports she is currently struggling with a lot of situational stressors.  Her husband recently had a tumor removed from his bladder and is currently recovering.  Patient reports she had a cousin who overdosed and passed away recently.  Her stepson and his partner have separated and she is currently taking care of their 27-year-old grandson.  She hence has been through a lot of psychosocial stressors.  She however reports she has been coping okay.  Reports she missed an appointment with her therapist recently due to scheduling issue.  She does not know if she wants to continue psychotherapy at this point or not.  Denies any significant sadness, crying spells.  Reports although anxious she has been coping okay.  Reports sleep is good as long as she takes the trazodone.  Denies suicidality, homicidality or perceptual disturbances.  Denies any other concerns today.  Visit Diagnosis:    ICD-10-CM   1. Bipolar disorder, in full remission, most recent episode depressed (HCC)  F31.76     2. GAD (generalized anxiety disorder)  F41.1     3. Social anxiety disorder  F40.10     4. Primary insomnia  F51.01 traZODone (DESYREL) 50 MG tablet      Past Psychiatric History: Reviewed past psychiatric history from progress note on 03/04/2018.  Past trials of Viibryd, duloxetine, Pristiq, Zoloft, Lexapro, Latuda, Prozac, Brintellix.  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 No pertinent surgical history.  Family Psychiatric History: Reviewed family psychiatric history from progress note on 03/04/2018.  Family History:  Family History  Problem Relation Age of Onset   Depression Mother    Alcohol abuse Father    Breast cancer Maternal Aunt 48   Alcohol abuse Paternal Uncle    Depression Maternal Grandmother    Breast cancer Maternal Grandmother 73   Drug abuse Cousin     Social History: Reviewed social history from progress note on 03/04/2018. Social History   Socioeconomic History   Marital status: Married    Spouse name: Not on file   Number of children: 2   Years of education: Not on file   Highest education level: Associate degree: occupational, Scientist, product/process development, or vocational program  Occupational History    Comment: retired  Tobacco Use   Smoking status: Former    Packs/day: 0.25    Types: Cigarettes    Quit date: 03/22/2018    Years since quitting: 4.0   Smokeless tobacco: Never  Vaping Use   Vaping Use: Former  Substance and Sexual Activity   Alcohol use: Yes    Alcohol/week: 2.0 standard drinks of alcohol    Types: 2 Glasses of wine per week   Drug use: Yes    Types: Marijuana    Comment: about  a month ago   Sexual activity: Yes  Other Topics Concern   Not on file  Social History Narrative   Not on file   Social Determinants of Health   Financial Resource Strain: Low Risk  (03/04/2018)   Overall Financial Resource Strain (CARDIA)    Difficulty of Paying Living Expenses: Not hard at all  Food Insecurity: No Food Insecurity (03/04/2018)   Hunger Vital Sign    Worried About Running Out of Food in the Last Year: Never true    Ran Out of Food in the Last Year: Never true  Transportation Needs: No Transportation  Needs (03/04/2018)   PRAPARE - Administrator, Civil Service (Medical): No    Lack of Transportation (Non-Medical): No  Physical Activity: Inactive (03/04/2018)   Exercise Vital Sign    Days of Exercise per Week: 0 days    Minutes of Exercise per Session: 0 min  Stress: No Stress Concern Present (03/04/2018)   [employer] of Occupational Health - Occupational Stress Questionnaire    Feeling of Stress : Not at all  Social Connections: Somewhat Isolated (03/04/2018)   Social Connection and Isolation Panel [NHANES]    Frequency of Communication with Friends and Family: More than three times a week    Frequency of Social Gatherings with Friends and Family: Once a week    Attends Religious Services: More than 4 times per year    Active Member of [employer] or Organizations: No    Attends Banker Meetings: Never    Marital Status: Divorced    Allergies:  Allergies  Allergen Reactions   Hydralazine Hcl Itching   Erythromycin Nausea Only   Hydralazine Itching   Hydrochlorothiazide Itching   Vortioxetine Nausea Only    Metabolic Disorder Labs: No results found for: "HGBA1C", "MPG" No results found for: "PROLACTIN" No results found for: "CHOL", "TRIG", "HDL", "CHOLHDL", "VLDL", "LDLCALC" No results found for: "TSH"  Therapeutic Level Labs: No results found for: "LITHIUM" No results found for: "VALPROATE" No results found for: "CBMZ"  Current Medications: Current Outpatient Medications  Medication Sig Dispense Refill   buPROPion (WELLBUTRIN XL) 300 MG 24 hr tablet TAKE 1 TABLET BY MOUTH EVERY DAY 90 tablet 1   Calcium Carb-Cholecalciferol (OYSTER SHELL CALCIUM) 500-400 MG-UNIT TABS Take by mouth.     Cholecalciferol 10 MCG (400 UNIT) CHEW Chew by mouth.     ezetimibe (ZETIA) 10 MG tablet Take 1 tablet by mouth daily.     FLUoxetine (PROZAC) 10 MG capsule TAKE 1 CAPSULE (10 MG TOTAL) BY MOUTH DAILY WITH BREAKFAST. 90 capsule 1   levothyroxine (SYNTHROID) 50  MCG tablet Take 1.5 tablets by mouth daily. Takes differently     lisinopril (ZESTRIL) 20 MG tablet Take 20 mg by mouth daily.     loratadine (CLARITIN) 10 MG tablet Take 10 mg by mouth daily.     Multiple Vitamin (MULTI-VITAMIN) tablet Take 1 tablet by mouth daily.     pantoprazole (PROTONIX) 40 MG tablet Take 40 mg by mouth daily.     simvastatin (ZOCOR) 20 MG tablet Take by mouth.     Omega-3 Fatty Acids (FISH OIL) 1000 MG CAPS Take by mouth. (Patient not taking: Reported on 04/10/2022)     Pancrelipase, Lip-Prot-Amyl, (ZENPEP) 40000-126000 units CPEP TAKE 2 CAPSULES WITH THE FIRST BITE OF EACH MEAL AND 1 CAPSULE WITH THE FIRST BITE OF [facility] (Patient not taking: Reported on 04/10/2022) 300 capsule 4   traZODone (DESYREL) 50 MG tablet  Take 1 tablet (50 mg total) by mouth at bedtime as needed for sleep. Patient has supplies 30 tablet 0   No current facility-administered medications for this visit.     Musculoskeletal: Strength & Muscle Tone: within normal limits Gait & Station: normal Patient leans: N/A  Psychiatric Specialty Exam: Review of Systems  Psychiatric/Behavioral:  The patient is nervous/anxious.   All other systems reviewed and are negative.   Blood pressure 130/66, pulse 77, temperature 97.8 F (36.6 C), temperature source Temporal, weight 119 lb 12.8 oz (54.3 kg).Body mass index is 24.2 kg/m.  General Appearance: Casual  Eye Contact:  Fair  Speech:  Clear and Coherent  Volume:  Normal  Mood:  Anxious, coping well  Affect:  Congruent  Thought Process:  Goal Directed and Descriptions of Associations: Intact  Orientation:  Full (Time, Place, and Person)  Thought Content: Logical   Suicidal Thoughts:  No  Homicidal Thoughts:  No  Memory:  Immediate;   Fair Recent;   Fair Remote;   Fair  Judgement:  Fair  Insight:  Fair  Psychomotor Activity:  Normal  Concentration:  Concentration: Fair and Attention Span: Fair  Recall:  [employer] of Knowledge: Fair   Language: Fair  Akathisia:  No  Handed:  Right  AIMS (if indicated): done  Assets:  Communication Skills Desire for Improvement Housing Social Support  ADL's:  Intact  Cognition: WNL  Sleep:  Fair   Screenings: AIMS    Flowsheet Row Office Visit from 04/10/2022 in [facility]  AIMS Total Score 0      GAD-7    Flowsheet Row Office Visit from 08/16/2021 in [facility]  Total GAD-7 Score 10      PHQ2-9    Flowsheet Row Office Visit from 04/10/2022 in [facility] Office Visit from 11/21/2021 in [facility] Office Visit from 08/16/2021 in [facility] Counselor from 07/19/2021 in [facility] Video Visit from 11/24/2020 in [facility]  PHQ-2 Total Score 3 2 1  0 0  PHQ-9 Total Score 11 11 13  -- --      Flowsheet Row Office Visit from 04/10/2022 in [facility] Counselor from 08/22/2021 in [facility] Office Visit from 08/16/2021 in [facility]  C-SSRS RISK CATEGORY No Risk No Risk No Risk        Assessment and Plan: Page Pucciarelli is a 71 year old Caucasian female who has a history of bipolar disorder, GAD, hypothyroidism was evaluated in office today.  Patient with multiple psychosocial stressors, however coping okay will continue to benefit from current medication regimen.  Plan as noted below.  Plan Bipolar disorder type II in remission Wellbutrin XL 300 mg p.o. daily Prozac 10 mg p.o. daily We will consider restarting Lamictal in the future if needed, patient used to be on Lamictal in the past-however was noncompliant.   GAD-stable Continue CBT as needed Prozac 10 mg p.o. daily  Social anxiety disorder-stable Continue CBT  Primary insomnia-stable Trazodone 50 mg p.o. nightly as  needed  I have coordinated care with Ms. Christina Hussami -discussed recent scheduling problems.  Follow-up in clinic in 3 to 4 months or sooner if needed.  Collaboration of Care: Collaboration of Care: Referral or follow-up with counselor/therapist AEB encouraged to continue psychotherapy sessions.  Patient/Guardian was advised Release of Information must be obtained prior to any record release in order to collaborate their care with an outside  provider. Patient/Guardian was advised if they have not already done so to contact the registration department to sign all necessary forms in order for [place] to release information regarding their care.   Consent: Patient/Guardian gives verbal consent for treatment and assignment of benefits for services provided during this visit. Patient/Guardian expressed understanding and agreed to proceed.   This note was generated in part or whole with voice recognition software. Voice recognition is usually quite accurate but there are transcription errors that can and very often do occur. I apologize for any typographical errors that were not detected and corrected.      Jomarie Longs, MD 04/11/2022, 12:03 PM

## 2022-04-11 ENCOUNTER — Encounter: Payer: Self-pay | Admitting: Psychiatry

## 2022-07-17 ENCOUNTER — Ambulatory Visit (INDEPENDENT_AMBULATORY_CARE_PROVIDER_SITE_OTHER): Payer: Medicare Other | Admitting: Psychiatry

## 2022-07-17 ENCOUNTER — Other Ambulatory Visit: Payer: Self-pay | Admitting: Psychiatry

## 2022-07-17 ENCOUNTER — Encounter: Payer: Self-pay | Admitting: Psychiatry

## 2022-07-17 VITALS — BP 151/80 | HR 53 | Temp 97.3°F | Ht 59.0 in | Wt 120.0 lb

## 2022-07-17 DIAGNOSIS — F3176 Bipolar disorder, in full remission, most recent episode depressed: Secondary | ICD-10-CM

## 2022-07-17 DIAGNOSIS — F4010 Social phobia, unspecified: Secondary | ICD-10-CM

## 2022-07-17 DIAGNOSIS — F411 Generalized anxiety disorder: Secondary | ICD-10-CM

## 2022-07-17 DIAGNOSIS — F4321 Adjustment disorder with depressed mood: Secondary | ICD-10-CM

## 2022-07-17 DIAGNOSIS — F5101 Primary insomnia: Secondary | ICD-10-CM

## 2022-07-17 MED ORDER — FLUOXETINE HCL 10 MG PO TABS: 10.0000 mg | ORAL_TABLET | Freq: Every day | ORAL | 1 refills | Status: DC

## 2022-07-17 NOTE — Patient Instructions (Addendum)
[email or web address]  www.psychologytoday  [facility] - [phone]  [facility] - [phone]  [facility] - [place] [phone]  [facility] - [phone]

## 2022-07-17 NOTE — Progress Notes (Signed)
[facility] MD OP Progress Note  07/17/2022 4:28 PM Julie Harrison  MRN:  [id number]  Chief Complaint:  Chief Complaint  Patient presents with   Follow-up   Anxiety   Depression   HPI: Julie Harrison is a 71 year old Caucasian female, married, retired, lives in [place], has a history of bipolar disorder, GAD, social anxiety disorder, hypothyroidism, hypertension was evaluated in office today.  Patient today reports she is currently having situational stressors, her stepson is going through separation and she has had some difficulty getting in touch with her 77-year-old grandson whom she was close to and has taken care of her for a very long time.  Patient reports however that has changed in the past few weeks.  Patient reports she has been able to see him more and that is a relief.  Patient reports when she felt sad about the situation and was struggling with low mood and anxiety her primary care provider-Dr. Tressia Miners started her on a higher dosage of the Prozac, increased to 20 mg daily.  Patient reports she however does not seem to be tolerating it well.  Currently struggles with nervousness, heart palpitations, tremulousness and so on since being on the higher dosage.  Patient reports she may have had these kind of side effects in the past on the higher dosage of Prozac.  Patient also reports she does have GI problems, fat malabsorption disorder and is not supposed to take anything that has gelatin in it.  Patient hence would like to change her Prozac capsule into a tablet.  She is also interested in reducing the dosage.  Patient denies any suicidality, homicidality or perceptual disturbances.  Reports sleep is restless due to her cat needing help at night. She does have restless leg symptoms.  Recently started on Requip at bedtime however has not started it yet, wanted to talk to writer also about this.  Continues to be compliant on the Wellbutrin.  Denies any suicidality, homicidality or  perceptual disturbances.  Interested in psychotherapy sessions, however would like to find a new therapist.  Denies any other concerns today.    Visit Diagnosis:    ICD-10-CM   1. Bipolar disorder, in full remission, most recent episode depressed (HCC)  F31.76 FLUoxetine (PROZAC) 10 MG tablet    2. GAD (generalized anxiety disorder)  F41.1 FLUoxetine (PROZAC) 10 MG tablet    3. Social anxiety disorder  F40.10 FLUoxetine (PROZAC) 10 MG tablet    4. Primary insomnia  F51.01     5. Adjustment disorder with depressed mood  F43.21 FLUoxetine (PROZAC) 10 MG tablet      Past Psychiatric History: Reviewed past psychiatric history from progress note on 03/04/2018.  Past trials of Viibryd, duloxetine, Pristiq, Zoloft, Lexapro, Latuda, Prozac, Brintellix.  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 No pertinent surgical history.  Family Psychiatric History: Reviewed family psychiatric history from progress note on 03/04/2018.  Family History:  Family History  Problem Relation Age of Onset   Depression Mother    Alcohol abuse Father    Breast cancer Maternal Aunt 60   Alcohol abuse Paternal Uncle    Depression Maternal Grandmother    Breast cancer Maternal Grandmother 60   Drug abuse  Cousin     Social History: Reviewed social history from progress note on 03/04/2018. Social History   Socioeconomic History   Marital status: Married    Spouse name: Not on file   Number of children: 2   Years of education: Not on file   Highest education level: Associate degree: occupational, Scientist, product/process development, or vocational program  Occupational History    Comment: retired  Tobacco Use   Smoking status: Former    Packs/day: 0.25    Types: Cigarettes     Quit date: 03/22/2018    Years since quitting: 4.3   Smokeless tobacco: Never  Vaping Use   Vaping Use: Former  Substance and Sexual Activity   Alcohol use: Yes    Alcohol/week: 2.0 standard drinks of alcohol    Types: 2 Glasses of wine per week   Drug use: Yes    Types: Marijuana    Comment: about a month ago   Sexual activity: Yes  Other Topics Concern   Not on file  Social History Narrative   Not on file   Social Determinants of Health   Financial Resource Strain: Low Risk  (03/04/2018)   Overall Financial Resource Strain (CARDIA)    Difficulty of Paying Living Expenses: Not hard at all  Food Insecurity: No Food Insecurity (03/04/2018)   Hunger Vital Sign    Worried About Running Out of Food in the Last Year: Never true    Ran Out of Food in the Last Year: Never true  Transportation Needs: No Transportation Needs (03/04/2018)   PRAPARE - Administrator, Civil Service (Medical): No    Lack of Transportation (Non-Medical): No  Physical Activity: Inactive (03/04/2018)   Exercise Vital Sign    Days of Exercise per Week: 0 days    Minutes of Exercise per Session: 0 min  Stress: No Stress Concern Present (03/04/2018)   [employer] of Occupational Health - Occupational Stress Questionnaire    Feeling of Stress : Not at all  Social Connections: Somewhat Isolated (03/04/2018)   Social Connection and Isolation Panel [NHANES]    Frequency of Communication with Friends and Family: More than three times a week    Frequency of Social Gatherings with Friends and Family: Once a week    Attends Religious Services: More than 4 times per year    Active Member of [employer] or Organizations: No    Attends Banker Meetings: Never    Marital Status: Divorced    Allergies:  Allergies  Allergen Reactions   Hydralazine Hcl Itching   Erythromycin Nausea Only   Hydralazine Itching   Hydrochlorothiazide Itching   Vortioxetine Nausea Only    Metabolic Disorder  Labs: No results found for: "HGBA1C", "MPG" No results found for: "PROLACTIN" No results found for: "CHOL", "TRIG", "HDL", "CHOLHDL", "VLDL", "LDLCALC" No results found for: "TSH"  Therapeutic Level Labs: No results found for: "LITHIUM" No results found for: "VALPROATE" No results found for: "CBMZ"  Current Medications: Current Outpatient Medications  Medication Sig Dispense Refill   buPROPion (WELLBUTRIN XL) 300 MG 24 hr tablet TAKE 1 TABLET BY MOUTH EVERY DAY 90 tablet 1   ezetimibe (ZETIA) 10 MG tablet Take 1 tablet by mouth daily.     FLUoxetine (PROZAC) 10 MG tablet Take 1 tablet (10 mg total) by mouth daily. Stop Fluoxetine capsule , dose change 90 tablet 1   levothyroxine (SYNTHROID) 50 MCG tablet Take 1.5 tablets by mouth daily. Takes differently     lisinopril (ZESTRIL)  20 MG tablet Take 20 mg by mouth daily.     loratadine (CLARITIN) 10 MG tablet Take 10 mg by mouth daily.     Multiple Vitamin (MULTI-VITAMIN) tablet Take 1 tablet by mouth daily.     pantoprazole (PROTONIX) 40 MG tablet Take 40 mg by mouth daily.     simvastatin (ZOCOR) 40 MG tablet Take 40 mg by mouth at bedtime.     rOPINIRole (REQUIP) 0.5 MG tablet Take 0.5 mg by mouth 2 times daily at 12 noon and 4 pm. (Patient not taking: Reported on 07/17/2022)     No current facility-administered medications for this visit.     Musculoskeletal: Strength & Muscle Tone: within normal limits Gait & Station: normal Patient leans: N/A  Psychiatric Specialty Exam: Review of Systems  Psychiatric/Behavioral:  Positive for decreased concentration, dysphoric mood and sleep disturbance. The patient is nervous/anxious.   All other systems reviewed and are negative.   Blood pressure (!) 151/80, pulse (!) 53, temperature (!) 97.3 F (36.3 C), temperature source Temporal, height 4\' 11"  (1.499 m), weight 120 lb (54.4 kg).Body mass index is 24.24 kg/m.  General Appearance: Casual  Eye Contact:  Fair  Speech:  Clear and  Coherent  Volume:  Normal  Mood:  Anxious and Depressed  Affect:  Appropriate  Thought Process:  Goal Directed and Descriptions of Associations: Intact  Orientation:  Full (Time, Place, and Person)  Thought Content: Logical   Suicidal Thoughts:  No  Homicidal Thoughts:  No  Memory:  Immediate;   Fair Recent;   Fair Remote;   Fair  Judgement:  Fair  Insight:  Fair  Psychomotor Activity:  Normal  Concentration:  Concentration: Fair and Attention Span: Fair  Recall:  of Knowledge: Fair  Language: Fair  Akathisia:  No  Handed:  Right  AIMS (if indicated): not done  Assets:  Communication Skills Desire for Improvement Housing Intimacy Social Support Talents/Skills Transportation  ADL's:  Intact  Cognition: WNL  Sleep:   restless due to cat needing help   Screenings: AIMS    Flowsheet Row Office Visit from 04/10/2022 in [facility]  AIMS Total Score 0      GAD-7    Flowsheet Row Office Visit from 07/17/2022 in [facility] Office Visit from 08/16/2021 in [facility]  Total GAD-7 Score 10 10      PHQ2-9    Flowsheet Row Office Visit from 07/17/2022 in [facility] Office Visit from 04/10/2022 in [facility] Office Visit from 11/21/2021 in [facility] Office Visit from 08/16/2021 in [facility] Counselor from 07/19/2021 in [facility]  PHQ-2 Total Score 2 3 2 1  0  PHQ-9 Total Score 11 11 11 13  --      Flowsheet Row Office Visit from 07/17/2022 in [facility] Office Visit from 04/10/2022 in [facility] Counselor from 08/22/2021 in [facility]  C-SSRS RISK CATEGORY No Risk No Risk No Risk        Assessment and Plan: Julie Harrison is a 71 year old  Caucasian female who has a history of bipolar disorder, GAD, hypothyroidism was evaluated in office today.  Patient with multiple psychosocial stressors including recent stressors of her stepson going through separation and inability to have contact with grandson whom she is close to, currently on a higher dosage of Prozac with possible adverse side effects, discussed plan as noted  below.  Plan Bipolar disorder type II in remission Wellbutrin XL 300 mg p.o. daily Continue Prozac as noted below. We will consider adding a mood stabilizer as needed in the future.  GAD-unstable Patient advised to reestablish care with a therapist. Reduce Prozac to 10 mg p.o. daily and change it into a tablet due to her history of GI problems. Patient today reports possible adverse side effects to the Prozac higher dosage. Discussed adding another medication, patient is not interested.  Social anxiety disorder-stable Continue to monitor  Primary insomnia-unstable And to work on sleep hygiene.  Sleep problems are due to her pets needing help at night Will monitor closely  Adjustment disorder with depressed mood-unstable Reduce Prozac to 10 mg p.o. daily as noted above due to side effects.  Patient is not interested in adding a new medication today. Patient advised to establish care with a therapist, provided resources in the community.  Follow-up in clinic in 2 to 3 months or sooner if needed.   This note was generated in part or whole with voice recognition software. Voice recognition is usually quite accurate but there are transcription errors that can and very often do occur. I apologize for any typographical errors that were not detected and corrected.    Jomarie Longs, MD 07/17/2022, 4:28 PM

## 2022-07-18 NOTE — Telephone Encounter (Signed)
Change to Prozac capsule since tablet not covered. Patient OK with change.

## 2022-08-01 ENCOUNTER — Encounter: Payer: Self-pay | Admitting: Gastroenterology

## 2022-08-01 ENCOUNTER — Ambulatory Visit (INDEPENDENT_AMBULATORY_CARE_PROVIDER_SITE_OTHER): Payer: Medicare Other | Admitting: Gastroenterology

## 2022-08-01 ENCOUNTER — Other Ambulatory Visit: Payer: Self-pay

## 2022-08-01 VITALS — BP 136/71 | HR 67 | Temp 98.2°F | Ht 59.0 in | Wt 121.1 lb

## 2022-08-01 DIAGNOSIS — Z91018 Allergy to other foods: Secondary | ICD-10-CM

## 2022-08-01 DIAGNOSIS — K529 Noninfective gastroenteritis and colitis, unspecified: Secondary | ICD-10-CM | POA: Diagnosis not present

## 2022-08-01 DIAGNOSIS — Z86010 Personal history of colonic polyps: Secondary | ICD-10-CM | POA: Diagnosis not present

## 2022-08-01 MED ORDER — NA SULFATE-K SULFATE-MG SULF 17.5-3.13-1.6 GM/177ML PO SOLN: 354.0000 mL | Freq: Once | ORAL | 0 refills | Status: AC

## 2022-08-01 MED ORDER — DICYCLOMINE HCL 10 MG PO CAPS: 10.0000 mg | ORAL_CAPSULE | Freq: Four times a day (QID) | ORAL | 0 refills | Status: DC | PRN

## 2022-08-01 NOTE — Progress Notes (Signed)
Julie Repress, MD [address]  [address]  [place], [place] [address]  Main: [phone]  Fax: [phone]    Gastroenterology Consultation  Referring Provider:     Enid Baas, MD Primary Care Physician:  Enid Baas, MD Primary Gastroenterologist:  Dr. Arlyss Harrison Reason for Consultation:   Chronic diarrhea, abdominal bloating     HPI:   Julie Harrison is a 71 y.o. female referred by Dr. Enid Baas, MD  for consultation & management of abdominal cramps and diarrhea.  Patient has several years history of postprandial urgency with loose stools associated with abdominal cramps, bloating.  She was originally evaluated by Dr. Norma Fredrickson at [facility], underwent upper endoscopy as well as colonoscopy which were unremarkable except for mild erosive esophagitis.  Random colon biopsies were normal.  Patient felt like her symptoms were not addressed and she followed up at [facility].  She underwent sits marker study at [facility] which revealed moderate stool burden and retention of sitz markers.  Subsequently, she was recommended to undergo anorectal manometry and she did not pursue it at that time.  Patient does have history of anxiety and she notices anxiety triggers her GI symptoms.  She does drink sodas, sweet tea regularly as well as consumes red meat few times a week.  Her weight has been stable.  Her labs including CBC, CMP were unremarkable, normal HbA1c.  She does have history of hypothyroidism, currently on thyroid replacement therapy  Follow-up video visit 05/10/2021 I saw Ms. Harue Harrison end of May for chronic diarrhea and cramps after she underwent work-up by Dr. Norma Fredrickson.  I checked her pancreatic fecal elastase levels which confirmed moderate pancreatic insufficiency.  Her levels were 128.  I recommended Zenpep 40 K 2 capsules with each meal and 1 with snack.  Patient had several questions regarding the new diagnosis and therefore we have arranged MyChart video  visit. Patient has not started Zenpep yet.  Patient did not get the CT scan done yet  Patient does not smoke or drink alcohol Ex tobacco use  Follow-up visit 06/20/2021 Patient is here for follow-up of new diagnosis of EPI.  Her pancreatic fecal elastase levels were 128.  She has started Zenpep 2 capsules with each meal and 1 with snack.  She reports significant improvement in abdominal cramps and bloating.  She is not experiencing diarrhea.  She has regular bowel movement every 2 days without any significant straining or hard stools.  Her weight has been stable.  Follow-up visit 10/23/2021 Patient is here for follow-up of exocrine pancreatic insufficiency.  Patient is concerned about ongoing GI symptoms, she has been experiencing intermittent episodes of postprandial cramps, bloating and nonbloody loose stools that occur about 1 to 2 days weekly.  She is concerned about postprandial abdominal cramps that last despite having a bowel movement.  Her weight has been stable.  She did notice that when she limits dairy products, her symptoms are somewhat better.  She continues to take Zenpep 1 to 2 capsules with each meal and 1 with snack.  She also noticed that when she avoids beef, her symptoms improve.  She would like to know if she has any food intolerance/allergy.  Her symptoms are affecting her going out.  Patient does have history of generalized anxiety disorder for which she takes Wellbutrin and Prozac, followed by her psychiatrist.  She is supposed to follow-up with psychologist for therapy sessions which has not lately.  She has been on these medications for  last 2 years.  She also noticed that whenever she is anxious, her GI symptoms get worse  Follow-up visit 01/30/2022 Patient is here for follow-up of pancreatic insufficiency, chronic diarrhea and abdominal bloating.  Patient is currently on Zenpep 40 K 2 capsules before each meal and 1 with snack.  Since last visit, she reports that her symptoms of  diarrhea and bloating have significantly improved.  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  Patient has eliminated beef, pork and lamb and noticed significant improvement in her symptoms.  She also noticed that when she has a meal that eats cheese heavy, next day, she has worsening of symptoms.  She is questioning the diagnosis of EPI because she never felt significantly better on pancreatic enzymes.  She underwent CT abdomen pelvis with contrast which was unremarkable.   Follow-up visit 08/01/2022 Patient is here for follow up of chronic diarrhea, abdominal bloating.  Patient reports that she has been experiencing intermittent episodes of abdominal cramps with loose stools depending on what she eats.  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  When she is needed these food products, she felt better.  When she reintroduced, she noticed recurrence of symptoms.  She is also interested to undergo repeat testing for EPI.   NSAIDs: None  Antiplts/Anticoagulants/Anti thrombotics: None  GI Procedures:  -Colonoscopy:08/05/2017 - Normal random colon bx, TA polyp x 1, diverticulosis, internal hemorroids - EGD: 08/05/2017 - Gastritis, no h pylori, LA Grade A esophagitis  Sits marker study at [facility] 12/16/2019 FINDINGS:  Thirty-one Sitz markers are noted throughout the colon. The distal  most marker is likely at the level of the distal sigmoid colon. The  bowel gas pattern is nonobstructive. There is a moderate amount of  stool throughout the colon. There are degenerative changes of the  visualized thoracolumbar spine and bilateral hips, left worse than  right.   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  No pertinent surgical history.  Current Outpatient Medications:    buPROPion (WELLBUTRIN XL) 300 MG 24 hr tablet, TAKE 1 TABLET BY MOUTH EVERY DAY, Disp: 90 tablet, Rfl: 1   dicyclomine (BENTYL) 10 MG capsule, Take 1 capsule (10 mg total) by mouth every 6 (six) hours as needed for spasms., Disp: 30 capsule, Rfl: 0   ezetimibe (ZETIA) 10 MG tablet, Take 1 tablet by mouth daily., Disp: , Rfl:    FLUoxetine (PROZAC) 10 MG capsule, TAKE 1 CAPSULE BY MOUTH EVERY DAY, Disp: 90 capsule, Rfl: 0   levothyroxine (SYNTHROID) 50 MCG tablet, Take 1.5 tablets by mouth daily. Takes differently, Disp: , Rfl:    lisinopril (ZESTRIL) 20 MG tablet, Take 30 mg by mouth daily., Disp: , Rfl:    loratadine (CLARITIN) 10 MG tablet, Take 10 mg by mouth daily., Disp: , Rfl:    Multiple Vitamin (MULTI-VITAMIN) tablet, Take 1 tablet by mouth daily., Disp: , Rfl:    Na Sulfate-K Sulfate-Mg Sulf 17.5-3.13-1.6 GM/177ML SOLN, Take 354 mLs by mouth once for 1 dose., Disp: 354 mL, Rfl: 0   pantoprazole (PROTONIX) 40 MG tablet, Take 40 mg by mouth daily., Disp: , Rfl:  simvastatin (ZOCOR) 40 MG tablet, Take 40 mg by mouth at bedtime., Disp: , Rfl:    rOPINIRole (REQUIP) 0.5 MG tablet, Take 0.5 mg by mouth 2 times daily at 12 noon and 4 pm. (Patient not taking: Reported on 07/17/2022), Disp: , Rfl:    Family History  Problem Relation Age of Onset   Depression Mother    Alcohol abuse Father    Breast cancer Maternal Aunt 68   Alcohol abuse Paternal Uncle    Depression Maternal Grandmother    Breast cancer Maternal Grandmother 36   Drug abuse Cousin      Social History   Tobacco Use   Smoking status: Former    Packs/day: 0.25    Types: Cigarettes    Quit date: 03/22/2018    Years since quitting: 4.3   Smokeless tobacco: Never  Vaping Use   Vaping Use: Former  Substance Use Topics   Alcohol use: Yes    Alcohol/week: 2.0 standard drinks of alcohol    Types: 2 Glasses of wine per week   Drug use: Yes    Types:  Marijuana    Comment: about a month ago    Allergies as of 08/01/2022 - Review Complete 08/01/2022  Allergen Reaction Noted   Hydralazine hcl Itching 07/12/2017   Erythromycin Nausea Only 11/08/2016   Hydralazine Itching 07/12/2017   Hydrochlorothiazide Itching 11/08/2016   Vortioxetine Nausea Only 11/08/2016    Review of Systems:    All systems reviewed and negative except where noted in HPI.   Physical Exam:  BP 136/71 (BP Location: Left Arm, Patient Position: Sitting, Cuff Size: Normal)   Pulse 67   Temp 98.2 F (36.8 C) (Oral)   Ht 4\' 11"  (1.499 m)   Wt 121 lb 2 oz (54.9 kg)   BMI 24.46 kg/m  No LMP recorded. Patient is postmenopausal.  General:   Alert,  Well-developed, well-nourished, pleasant and cooperative in NAD Head:  Normocephalic and atraumatic. Eyes:  Sclera clear, no icterus.   Conjunctiva pink. Ears:  Normal auditory acuity. Nose:  No deformity, discharge, or lesions. Mouth:  No deformity or lesions,oropharynx pink & moist. Neck:  Supple; no masses or thyromegaly. Lungs:  Respirations even and unlabored.  Clear throughout to auscultation.   No wheezes, crackles, or rhonchi. No acute distress. Heart:  Regular rate and rhythm; no murmurs, clicks, rubs, or gallops. Abdomen:  Normal bowel sounds. Soft, non-tender and nondistended without masses, hepatosplenomegaly or hernias noted.  No guarding or rebound tenderness.   Rectal: Not performed Msk:  Symmetrical without gross deformities. Good, equal movement & strength bilaterally. Pulses:  Normal pulses noted. Extremities:  No clubbing or edema.  No cyanosis. Neurologic:  Alert and oriented x3;  grossly normal neurologically. Skin:  Intact without significant lesions or rashes. No jaundice. Psych:  Alert and cooperative. Normal mood and affect.  Imaging Studies: Reviewed  Assessment and Plan:   Oluwatomisin Hustead is a 71 y.o. pleasant Caucasian female with history of anxiety, depression, history of  hypothyroidism, hypertension, history of tobacco use on Wellbutrin is seen in consultation for chronic symptoms of abdominal bloating, abdominal cramps as well as postprandial urgency, loose stools without rectal bleeding. EGD and colonoscopy were unremarkable, no evidence of H. pylori, random colon biopsies were normal.  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  Food allergy profile was positive for milk allergy, alpha gal panel was positive for elevated IgE levels to beef, pork and lab.  Patient reports history of Lyme's disease several years  ago after a tick bite.   Chronic diarrhea and abdominal bloating secondary to alpha gal and milk allergy/intolerance Recurrence of symptoms with reintroduction of red meat and milk Advised patient to try dicyclomine as needed for abdominal cramps and diarrhea when she is planning to try any red meat products Continue lactose-free diet, she can try lactobacillus probiotics Recheck pancreatic fecal elastase levels when her stools are formed Patient has currently stopped pancreatic enzymes celiac disease panel is negative  History of tubular adenoma of the colon Recommend surveillance colonoscopy   Follow up in 6 months   Julie Repress, MD

## 2022-08-09 ENCOUNTER — Ambulatory Visit: Payer: Medicare Other

## 2022-08-13 ENCOUNTER — Encounter (INDEPENDENT_AMBULATORY_CARE_PROVIDER_SITE_OTHER): Payer: Self-pay

## 2022-08-16 ENCOUNTER — Ambulatory Visit: Payer: Medicare Other

## 2022-08-23 ENCOUNTER — Ambulatory Visit: Payer: Medicare Other

## 2022-08-24 ENCOUNTER — Telehealth: Payer: Self-pay | Admitting: Gastroenterology

## 2022-08-24 ENCOUNTER — Ambulatory Visit: Payer: Self-pay

## 2022-08-24 NOTE — Telephone Encounter (Signed)
Called and got patient moved to 09/13/2022. Called trish and got patient moved. Patient states she has been sick and wants to feel better before she has procedure.

## 2022-08-24 NOTE — Telephone Encounter (Signed)
Patient called to reschedule colonoscopy. Requesting call back.

## 2022-09-12 ENCOUNTER — Encounter: Payer: Self-pay | Admitting: Gastroenterology

## 2022-09-13 ENCOUNTER — Encounter: Payer: Self-pay | Admitting: Gastroenterology

## 2022-09-13 ENCOUNTER — Ambulatory Visit: Payer: Medicare Other | Admitting: Anesthesiology

## 2022-09-13 ENCOUNTER — Encounter
Admission: RE | Disposition: A | Discharge status: Home / Self Care | Payer: Self-pay | Source: Home / Self Care | Attending: Gastroenterology

## 2022-09-13 ENCOUNTER — Ambulatory Visit
Admission: RE | Admit: 2022-09-13 | Discharge: 2022-09-13 | Disposition: A | Discharge status: Home / Self Care | Payer: Medicare Other | Attending: Gastroenterology | Admitting: Gastroenterology

## 2022-09-13 DIAGNOSIS — I2510 Atherosclerotic heart disease of native coronary artery without angina pectoris: Secondary | ICD-10-CM | POA: Diagnosis not present

## 2022-09-13 DIAGNOSIS — Z87891 Personal history of nicotine dependence: Secondary | ICD-10-CM | POA: Diagnosis not present

## 2022-09-13 DIAGNOSIS — I10 Essential (primary) hypertension: Secondary | ICD-10-CM | POA: Diagnosis not present

## 2022-09-13 DIAGNOSIS — Z860100 Personal history of colon polyps, unspecified: Secondary | ICD-10-CM

## 2022-09-13 DIAGNOSIS — F319 Bipolar disorder, unspecified: Secondary | ICD-10-CM | POA: Diagnosis not present

## 2022-09-13 DIAGNOSIS — F419 Anxiety disorder, unspecified: Secondary | ICD-10-CM | POA: Diagnosis not present

## 2022-09-13 DIAGNOSIS — K629 Disease of anus and rectum, unspecified: Secondary | ICD-10-CM

## 2022-09-13 DIAGNOSIS — Z1211 Encounter for screening for malignant neoplasm of colon: Secondary | ICD-10-CM | POA: Diagnosis present

## 2022-09-13 DIAGNOSIS — E039 Hypothyroidism, unspecified: Secondary | ICD-10-CM | POA: Diagnosis not present

## 2022-09-13 DIAGNOSIS — K529 Noninfective gastroenteritis and colitis, unspecified: Secondary | ICD-10-CM

## 2022-09-13 DIAGNOSIS — K5730 Diverticulosis of large intestine without perforation or abscess without bleeding: Secondary | ICD-10-CM | POA: Diagnosis not present

## 2022-09-13 HISTORY — PX: COLONOSCOPY WITH PROPOFOL: SHX5780

## 2022-09-13 SURGERY — COLONOSCOPY WITH PROPOFOL
Anesthesia: General

## 2022-09-13 MED ORDER — PROPOFOL 10 MG/ML IV BOLUS
INTRAVENOUS | Status: DC | PRN
  Administered 2022-09-13: 30 mg via INTRAVENOUS
  Administered 2022-09-13: 70 mg via INTRAVENOUS

## 2022-09-13 MED ORDER — SODIUM CHLORIDE 0.9 % IV SOLN: INTRAVENOUS | Status: DC

## 2022-09-13 MED ORDER — PROPOFOL 500 MG/50ML IV EMUL
INTRAVENOUS | Status: DC | PRN
  Administered 2022-09-13: 120 ug/kg/min via INTRAVENOUS

## 2022-09-13 MED ORDER — LIDOCAINE 2% (20 MG/ML) 5 ML SYRINGE
INTRAMUSCULAR | Status: DC | PRN
  Administered 2022-09-13: 20 mg via INTRAVENOUS

## 2022-09-13 NOTE — H&P (Signed)
Arlyss Repress, MD [address]  [address]  [place], [place] [address]  Main: [phone]  Fax: [phone] Pager: [phone]  Primary Care Physician:  Enid Baas, MD Primary Gastroenterologist:  Dr. Arlyss Repress  Pre-Procedure History & Physical: HPI:  Julie Harrison is a 71 y.o. female is here for an colonoscopy.   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 No pertinent surgical history.  Prior to Admission medications   Medication Sig Start Date End Date Taking? Authorizing Provider  buPROPion (WELLBUTRIN XL) 300 MG 24 hr tablet TAKE 1 TABLET BY MOUTH EVERY DAY 02/08/22  Yes Eappen, Levin Bacon, MD  ezetimibe (ZETIA) 10 MG tablet Take 1 tablet by mouth daily. 07/31/21 09/13/22 Yes [provider]  FLUoxetine (PROZAC) 10 MG capsule TAKE 1 CAPSULE BY MOUTH EVERY DAY 07/18/22  Yes Eappen, Levin Bacon, MD  levothyroxine (SYNTHROID) 50 MCG tablet Take 1.5 tablets by mouth daily. Takes differently 11/16/21  Yes [provider]  lisinopril (ZESTRIL) 20 MG tablet Take 30 mg by mouth daily. 01/10/22  Yes [provider]  Multiple Vitamin (MULTI-VITAMIN) tablet Take 1 tablet by mouth daily.   Yes [provider]  pantoprazole (PROTONIX) 40 MG tablet Take 40 mg by mouth daily. 02/03/19  Yes [provider]  simvastatin (ZOCOR) 40 MG tablet Take 40 mg by mouth at bedtime. 06/12/22  Yes [provider]  dicyclomine (BENTYL) 10 MG capsule Take 1 capsule (10 mg total) by mouth every 6 (six) hours as needed for spasms. 08/01/22   Toney Reil, MD  loratadine (CLARITIN) 10 MG tablet Take 10 mg by mouth daily.    [provider]  rOPINIRole (REQUIP) 0.5 MG tablet Take 0.5 mg by mouth 2 times daily  at 12 noon and 4 pm. Patient not taking: Reported on 07/17/2022    [provider]    Allergies as of 08/01/2022 - Review Complete 08/01/2022  Allergen Reaction Noted   Hydralazine hcl Itching 07/12/2017   Erythromycin Nausea Only 11/08/2016   Hydralazine Itching 07/12/2017   Hydrochlorothiazide Itching 11/08/2016   Vortioxetine Nausea Only 11/08/2016    Family History  Problem Relation Age of Onset   Depression Mother    Alcohol abuse Father    Breast cancer Maternal Aunt 63   Alcohol abuse Paternal Uncle    Depression Maternal Grandmother    Breast cancer Maternal Grandmother 59   Drug abuse Cousin     Social History   Socioeconomic History   Marital status: Married    Spouse name: Not on file   Number of children: 2   Years of education: Not on file   Highest education level: Associate degree: occupational, Scientist, product/process development, or vocational program  Occupational History    Comment: retired  Tobacco Use   Smoking status: Former    Packs/day: 0.25    Types: Cigarettes    Quit date: 03/22/2018    Years since quitting: 4.4   Smokeless tobacco: Never  Vaping Use   Vaping Use: Former  Substance and Sexual Activity   Alcohol use: Yes    Alcohol/week: 2.0 standard drinks of alcohol    Types: 2 Glasses of wine per week   Drug use: Yes    Types:  Marijuana    Comment: about a month ago   Sexual activity: Yes  Other Topics Concern   Not on file  Social History Narrative   Not on file   Social Determinants of Health   Financial Resource Strain: Low Risk  (03/04/2018)   Overall Financial Resource Strain (CARDIA)    Difficulty of Paying Living Expenses: Not hard at all  Food Insecurity: No Food Insecurity (03/04/2018)   Hunger Vital Sign    Worried About Running Out of Food in the Last Year: Never true    Ran Out of Food in the Last Year: Never true  Transportation Needs: No Transportation Needs (03/04/2018)   PRAPARE - Administrator, Civil Service  (Medical): No    Lack of Transportation (Non-Medical): No  Physical Activity: Inactive (03/04/2018)   Exercise Vital Sign    Days of Exercise per Week: 0 days    Minutes of Exercise per Session: 0 min  Stress: No Stress Concern Present (03/04/2018)   [employer] of Occupational Health - Occupational Stress Questionnaire    Feeling of Stress : Not at all  Social Connections: Somewhat Isolated (03/04/2018)   Social Connection and Isolation Panel [NHANES]    Frequency of Communication with Friends and Family: More than three times a week    Frequency of Social Gatherings with Friends and Family: Once a week    Attends Religious Services: More than 4 times per year    Active Member of [employer] or Organizations: No    Attends Banker Meetings: Never    Marital Status: Divorced  Catering manager Violence: Not At Risk (03/04/2018)   Humiliation, Afraid, Rape, and Kick questionnaire    Fear of Current or Ex-Partner: No    Emotionally Abused: No    Physically Abused: No    Sexually Abused: No    Review of Systems: See HPI, otherwise negative ROS  Physical Exam: BP (!) 154/81   Pulse 72   Temp (!) 97 F (36.1 C) (Temporal)   Resp 17   Ht 4\' 11"  (1.499 m)   Wt 52.5 kg   SpO2 100%   BMI 23.39 kg/m  General:   Alert,  pleasant and cooperative in NAD Head:  Normocephalic and atraumatic. Neck:  Supple; no masses or thyromegaly. Lungs:  Clear throughout to auscultation.    Heart:  Regular rate and rhythm. Abdomen:  Soft, nontender and nondistended. Normal bowel sounds, without guarding, and without rebound.   Neurologic:  Alert and  oriented x4;  grossly normal neurologically.  Impression/Plan: [name] is here for an colonoscopy to be performed for h/o colon adenoma  Risks, benefits, limitations, and alternatives regarding  colonoscopy have been reviewed with the patient.  Questions have been answered.  All parties agreeable.   Angelita Ingles, MD  09/13/2022,  9:53 AM

## 2022-09-13 NOTE — Anesthesia Postprocedure Evaluation (Signed)
Anesthesia Post Note  Patient: Anessia Oakland  Procedure(s) Performed: COLONOSCOPY WITH PROPOFOL  Patient location during evaluation: Endoscopy Anesthesia Type: General Level of consciousness: awake and alert Pain management: pain level controlled Vital Signs Assessment: post-procedure vital signs reviewed and stable Respiratory status: spontaneous breathing, nonlabored ventilation, respiratory function stable and patient connected to nasal cannula oxygen Cardiovascular status: blood pressure returned to baseline and stable Postop Assessment: no apparent nausea or vomiting Anesthetic complications: no   No notable events documented.   Last Vitals:  Vitals:   09/13/22 0923 09/13/22 1033  BP: (!) 154/81 109/61  Pulse: 72 75  Resp: 17 (!) 23  Temp: (!) 36.1 C (!) 36.1 C  SpO2: 100% 97%    Last Pain:  Vitals:   09/13/22 1033  TempSrc: Temporal  PainSc: Asleep                 Corinda Gubler

## 2022-09-13 NOTE — Transfer of Care (Signed)
Immediate Anesthesia Transfer of Care Note  Patient: Clemmie Buelna  Procedure(s) Performed: COLONOSCOPY WITH PROPOFOL  Patient Location: Endoscopy Unit  Anesthesia Type:General  Level of Consciousness: drowsy  Airway & Oxygen Therapy: Patient Spontanous Breathing  Post-op Assessment: Report given to RN and Post -op Vital signs reviewed and stable  Post vital signs: Reviewed  Last Vitals:  Vitals Value Taken Time  BP 109/61 09/13/22 1033  Temp 36.1 C 09/13/22 1033  Pulse 74 09/13/22 1034  Resp 22 09/13/22 1034  SpO2 97 % 09/13/22 1034  Vitals shown include unvalidated device data.  Last Pain:  Vitals:   09/13/22 1033  TempSrc: Temporal  PainSc:          Complications: No notable events documented.

## 2022-09-13 NOTE — Anesthesia Preprocedure Evaluation (Signed)
Anesthesia Evaluation  Patient identified by MRN, date of birth, ID band Patient awake    Reviewed: Allergy & Precautions, NPO status , Patient's Chart, lab work & pertinent test results  History of Anesthesia Complications Negative for: history of anesthetic complications  Airway Mallampati: II  TM Distance: >3 FB Neck ROM: Full    Dental  (+) Partial Lower, Partial Upper   Pulmonary neg pulmonary ROS, neg sleep apnea, neg COPD, Patient abstained from smoking.Not current smoker, former smoker   Pulmonary exam normal breath sounds clear to auscultation       Cardiovascular Exercise Tolerance: Good METShypertension, + CAD  (-) Past MI (-) dysrhythmias  Rhythm:Regular Rate:Normal - Systolic murmurs    Neuro/Psych  PSYCHIATRIC DISORDERS Anxiety Depression Bipolar Disorder   negative neurological ROS     GI/Hepatic ,neg GERD  ,,(+)     (-) substance abuse    Endo/Other  neg diabetesHypothyroidism    Renal/GU negative Renal ROS     Musculoskeletal   Abdominal   Peds  Hematology   Anesthesia Other Findings Past Medical History: No date: Hypertension No date: Moderate episode of recurrent major depression during  infancy to early childhood (HCC) No date: Personal history of colonic polyps No date: Thyroid disease No date: Tobacco dependence No date: Vitamin D deficiency  Reproductive/Obstetrics                             Anesthesia Physical Anesthesia Plan  ASA: 2  Anesthesia Plan: General   Post-op Pain Management: Minimal or no pain anticipated   Induction: Intravenous  PONV Risk Score and Plan: 3 and Propofol infusion, TIVA and Ondansetron  Airway Management Planned: Nasal Cannula  Additional Equipment: None  Intra-op Plan:   Post-operative Plan:   Informed Consent: I have reviewed the patients History and Physical, chart, labs and discussed the procedure including the  risks, benefits and alternatives for the proposed anesthesia with the patient or authorized representative who has indicated his/her understanding and acceptance.     Dental advisory given  Plan Discussed with: CRNA and Surgeon  Anesthesia Plan Comments: (Discussed risks of anesthesia with patient, including possibility of difficulty with spontaneous ventilation under anesthesia necessitating airway intervention, PONV, and rare risks such as cardiac or respiratory or neurological events, and allergic reactions. Discussed the role of CRNA in patient's perioperative care. Patient understands.)       Anesthesia Quick Evaluation

## 2022-09-13 NOTE — Op Note (Signed)
[facility] Gastroenterology Patient Name: Julie Harrison Procedure Date: 09/13/2022 9:57 AM MRN: [id number] Account #: [id number] Date of Birth: 1951/09/16 Admit Type: Outpatient Age: 71 Room: [facility] ENDO ROOM 3 Gender: Female Note Status: Finalized Instrument Name: Peds Colonoscope [id number] Procedure:             Colonoscopy Indications:           Surveillance: Personal history of adenomatous polyps                         on last colonoscopy > 5 years ago Providers:             Toney Reil MD, MD Referring MD:          Enid Baas, MD (Referring MD) Medicines:             General Anesthesia Complications:         No immediate complications. Estimated blood loss:                         Minimal. Procedure:             Pre-Anesthesia Assessment:                        - Prior to the procedure, a History and Physical was                         performed, and patient medications and allergies were                         reviewed. The patient is competent. The risks and                         benefits of the procedure and the sedation options and                         risks were discussed with the patient. All questions                         were answered and informed consent was obtained.                         Patient identification and proposed procedure were                         verified by the physician, the nurse, the                         anesthesiologist, the anesthetist and the technician                         in the pre-procedure area in the procedure room in the                         endoscopy suite. Mental Status Examination: alert and                         oriented. Airway Examination: normal oropharyngeal  airway and neck mobility. Respiratory Examination:                         clear to auscultation. CV Examination: normal.                         Prophylactic Antibiotics: The patient does not  require                         prophylactic antibiotics. Prior Anticoagulants: The                         patient has taken no anticoagulant or antiplatelet                         agents. ASA Grade Assessment: II - A patient with mild                         systemic disease. After reviewing the risks and                         benefits, the patient was deemed in satisfactory                         condition to undergo the procedure. The anesthesia                         plan was to use general anesthesia. Immediately prior                         to administration of medications, the patient was                         re-assessed for adequacy to receive sedatives. The                         heart rate, respiratory rate, oxygen saturations,                         blood pressure, adequacy of pulmonary ventilation, and                         response to care were monitored throughout the                         procedure. The physical status of the patient was                         re-assessed after the procedure.                        After obtaining informed consent, the colonoscope was                         passed under direct vision. Throughout the procedure,                         the patient's blood pressure, pulse, and oxygen  saturations were monitored continuously. The                         Colonoscope was introduced through the anus and                         advanced to the 10 cm into the ileum. The colonoscopy                         was performed without difficulty. The patient                         tolerated the procedure well. The quality of the bowel                         preparation was evaluated using the BBPS [facility] Bowel                         Preparation Scale) with scores of: Right Colon = 3,                         Transverse Colon = 3 and Left Colon = 3 (entire mucosa                         seen well with no residual  staining, small fragments                         of stool or opaque liquid). The total BBPS score                         equals 9. The terminal ileum, ileocecal valve,                         appendiceal orifice, and rectum were photographed. Findings:      The perianal and digital rectal examinations were normal. Pertinent       negatives include normal sphincter tone and no palpable rectal lesions.      The terminal ileum appeared normal.      A localized area of granular mucosa was found in the distal rectum       closer to the dentate line likely rectal mucosal prolapse. Biopsies were       taken with a cold forceps for histology. Estimated blood loss was       minimal.      Normal mucosa was found in the left colon and in the right colon.       Biopsies for histology were taken with a cold forceps from the entire       colon for evaluation of microscopic colitis.      Multiple large-mouthed diverticula were found in the sigmoid colon,       descending colon and splenic flexure. There was no evidence of       diverticular bleeding. Impression:            - The examined portion of the ileum was normal.                        - Granularity in the distal rectum. Biopsied.                        -  Normal mucosa in the left colon and in the right                         colon. Biopsied.                        - Severe diverticulosis in the sigmoid colon, in the                         descending colon and at the splenic flexure. There was                         no evidence of diverticular bleeding. Recommendation:        - Discharge patient to home (with escort).                        - Resume previous diet today.                        - Continue present medications.                        - Await pathology results.                        - Return to my office as previously scheduled.                        - Repeat colonoscopy in 10 years for screening                          purposes. Procedure Code(s):     --- Professional ---                        [phone], Colonoscopy, flexible; with biopsy, single or                         multiple Diagnosis Code(s):     --- Professional ---                        Z86.010, Personal history of colonic polyps                        K57.30, Diverticulosis of large intestine without                         perforation or abscess without bleeding                        K62.89, Other specified diseases of anus and rectum CPT copyright 2022 American Medical Association. All rights reserved. The codes documented in this report are preliminary and upon coder review may  be revised to meet current compliance requirements. Dr. Libby Maw Toney Reil MD, MD 09/13/2022 10:33:37 AM This report has been signed electronically. Number of Addenda: 0 Note Initiated On: 09/13/2022 9:57 AM Scope Withdrawal Time: 0 hours 15 minutes 31 seconds  Total Procedure Duration: 0 hours 18 minutes 56 seconds  Estimated Blood Loss:  Estimated blood loss was minimal.      [facility]

## 2022-09-14 ENCOUNTER — Encounter: Payer: Self-pay | Admitting: Gastroenterology

## 2022-09-14 LAB — SURGICAL PATHOLOGY

## 2022-11-07 ENCOUNTER — Other Ambulatory Visit: Payer: Self-pay | Admitting: *Deleted

## 2022-11-07 DIAGNOSIS — Z87891 Personal history of nicotine dependence: Secondary | ICD-10-CM

## 2022-11-07 DIAGNOSIS — Z122 Encounter for screening for malignant neoplasm of respiratory organs: Secondary | ICD-10-CM

## 2022-11-14 ENCOUNTER — Ambulatory Visit
Admission: RE | Admit: 2022-11-14 | Discharge: 2022-11-14 | Disposition: A | Discharge status: Home / Self Care | Payer: Medicare Other | Source: Ambulatory Visit | Attending: Acute Care | Admitting: Acute Care

## 2022-11-14 DIAGNOSIS — Z122 Encounter for screening for malignant neoplasm of respiratory organs: Secondary | ICD-10-CM | POA: Diagnosis present

## 2022-11-14 DIAGNOSIS — Z87891 Personal history of nicotine dependence: Secondary | ICD-10-CM

## 2022-11-15 ENCOUNTER — Other Ambulatory Visit: Payer: Self-pay | Admitting: Acute Care

## 2022-11-15 DIAGNOSIS — Z87891 Personal history of nicotine dependence: Secondary | ICD-10-CM

## 2022-11-15 DIAGNOSIS — Z122 Encounter for screening for malignant neoplasm of respiratory organs: Secondary | ICD-10-CM

## 2022-11-16 LAB — PANCREATIC ELASTASE, FECAL: Pancreatic Elastase, Fecal: 163 ug Elast./g — ABNORMAL LOW (ref >200)

## 2022-11-19 ENCOUNTER — Telehealth: Payer: Self-pay

## 2022-11-19 MED ORDER — ZENPEP 40000-126000 UNITS PO CPEP: ORAL_CAPSULE | ORAL | 0 refills | Status: AC

## 2022-11-19 NOTE — Telephone Encounter (Signed)
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  These results are similar to the stool test results from 03/2021.  She had a CT abdomen and pelvis at [facility] in 2022 which revealed mild atrophy of the pancreas, this means pancreas has shrunk from its normal size. Combination of these findings explains symptoms of chronic diarrhea and bloating.  She was on pancreatic enzymes in the past.  Recommend to restart Creon 36K or Zenpep 40K 1 to 2 capsules with first bite of each meal and 1 with snack  Please schedule a follow-up to see me in 4-6 months  Rohini Vanga

## 2022-11-19 NOTE — Telephone Encounter (Signed)
Patient verbalized understanding of results. She states she does not need a refill on the medication because she has a lot of the Zenpep at home. She will let us know when she does need it refill. She will call back to make follow up appointment

## 2022-11-26 ENCOUNTER — Telehealth: Payer: Self-pay | Admitting: Psychiatry

## 2022-11-26 ENCOUNTER — Other Ambulatory Visit: Payer: Self-pay | Admitting: Psychiatry

## 2022-11-26 DIAGNOSIS — F4010 Social phobia, unspecified: Secondary | ICD-10-CM

## 2022-11-26 DIAGNOSIS — F4321 Adjustment disorder with depressed mood: Secondary | ICD-10-CM

## 2022-11-26 DIAGNOSIS — F3176 Bipolar disorder, in full remission, most recent episode depressed: Secondary | ICD-10-CM

## 2022-11-26 DIAGNOSIS — F411 Generalized anxiety disorder: Secondary | ICD-10-CM

## 2022-11-26 NOTE — Telephone Encounter (Signed)
Patient needs appointment , last appointment was in October.Will have staff contact.

## 2022-12-24 ENCOUNTER — Other Ambulatory Visit (INDEPENDENT_AMBULATORY_CARE_PROVIDER_SITE_OTHER): Payer: Self-pay | Admitting: Vascular Surgery

## 2022-12-24 DIAGNOSIS — I7300 Raynaud's syndrome without gangrene: Secondary | ICD-10-CM

## 2022-12-25 ENCOUNTER — Encounter (INDEPENDENT_AMBULATORY_CARE_PROVIDER_SITE_OTHER): Payer: Medicare Other

## 2022-12-25 ENCOUNTER — Ambulatory Visit (INDEPENDENT_AMBULATORY_CARE_PROVIDER_SITE_OTHER): Payer: Medicare Other | Admitting: Vascular Surgery

## 2022-12-31 ENCOUNTER — Telehealth (INDEPENDENT_AMBULATORY_CARE_PROVIDER_SITE_OTHER): Payer: Medicare Other | Admitting: Psychiatry

## 2022-12-31 ENCOUNTER — Encounter: Payer: Self-pay | Admitting: Psychiatry

## 2022-12-31 DIAGNOSIS — F411 Generalized anxiety disorder: Secondary | ICD-10-CM

## 2022-12-31 DIAGNOSIS — F5101 Primary insomnia: Secondary | ICD-10-CM | POA: Diagnosis not present

## 2022-12-31 DIAGNOSIS — F4010 Social phobia, unspecified: Secondary | ICD-10-CM | POA: Diagnosis not present

## 2022-12-31 DIAGNOSIS — F3176 Bipolar disorder, in full remission, most recent episode depressed: Secondary | ICD-10-CM

## 2022-12-31 MED ORDER — BUPROPION HCL ER (XL) 300 MG PO TB24: 300.0000 mg | ORAL_TABLET | Freq: Every day | ORAL | 1 refills | Status: DC

## 2022-12-31 NOTE — Progress Notes (Unsigned)
Virtual Visit via Video Note  I connected with Julie Harrison on 12/31/22 at  4:30 PM EDT by a video enabled telemedicine application and verified that I am speaking with the correct person using two identifiers.  Location Provider Location : [facility] Patient Location : Home  Participants: Patient , Provider   I discussed the limitations of evaluation and management by telemedicine and the availability of in person appointments. The patient expressed understanding and agreed to proceed.   I discussed the assessment and treatment plan with the patient. The patient was provided an opportunity to ask questions and all were answered. The patient agreed with the plan and demonstrated an understanding of the instructions.   The patient was advised to call back or seek an in-person evaluation if the symptoms worsen or if the condition fails to improve as anticipated.                                                                 [place] MD OP Progress Note  01/01/2023 2:18 PM Julie Harrison  MRN:  [id number]  Chief Complaint:  Chief Complaint  Patient presents with   Follow-up   Medication Refill   Depression   Anxiety   HPI: Julie Harrison is a 72 year old Caucasian female, married, retired, lives in [place], has a history of bipolar disorder, GAD, social anxiety disorder, hypothyroidism, hypertension was evaluated by telemedicine today.  Patient today reports although she continues to have multiple situational stressors especially regarding her step son going through separation and not been able to visit her grandson, she has been coping better than before.  She reports she has accepted the fact that there is nothing much she can do to change the situation and that has definitely helped.  Has not been able to find a therapist yet however does have a list with her and is willing to work on it.  Patient reports overall mood symptoms are stable.  Denies any significant depression symptoms.   Although anxious she has been coping okay.  Reports sleep is good.  Patient denies any suicidality, homicidality or perceptual disturbances.  Currently compliant on Wellbutrin, fluoxetine.  Denies side effects.  Patient denies any other concerns today.  Visit Diagnosis:    ICD-10-CM   1. Bipolar disorder, in full remission, most recent episode depressed ([place])  F31.76 buPROPion (WELLBUTRIN XL) 300 MG 24 hr tablet    2. GAD (generalized anxiety disorder)  F41.1     3. Social anxiety disorder  F40.10     4. Primary insomnia  F51.01       Past Psychiatric History: I have reviewed past psychiatric history from progress note on 03/04/2018.  Past trials of Viibryd, duloxetine, Pristiq, Zoloft, Lexapro, Latuda, Prozac,Brintellix.  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  Family History:  Family History  Problem Relation Age of Onset   Depression Mother    Alcohol abuse Father    Breast cancer Maternal Aunt 16   Alcohol abuse Paternal Uncle    Depression Maternal Grandmother    Breast cancer Maternal Grandmother 17   Drug abuse Cousin     Social History: Reviewed social history from progress note on 03/04/2018. Social History   Socioeconomic History   Marital status: Married    Spouse name: Not on file   Number of children: 2   Years of education: Not on file   Highest education level: Associate degree:  occupational, Hotel manager, or vocational program  Occupational History    Comment: retired  Tobacco Use   Smoking status: Former    Packs/day: .25    Types: Cigarettes    Quit date: 03/22/2018    Years since quitting: 4.7   Smokeless tobacco: Never  Vaping Use   Vaping Use: Former  Substance and Sexual Activity   Alcohol use: Yes    Alcohol/week: 2.0 standard drinks of alcohol    Types: 2 Glasses of wine per week   Drug use: Yes    Types: Marijuana    Comment: about a month ago   Sexual activity: Yes  Other Topics Concern   Not on file  Social History Narrative   Not on file   Social Determinants of Health   Financial Resource Strain: Low Risk  (03/04/2018)   Overall Financial Resource Strain (CARDIA)    Difficulty of Paying Living Expenses: Not hard at all  Food Insecurity: No Food Insecurity (03/04/2018)   Hunger Vital Sign    Worried About Running Out of Food in the Last Year: Never true    Ran Out of Food in the Last Year: Never true  Transportation Needs: No Transportation Needs (03/04/2018)   PRAPARE - Hydrologist (Medical): No    Lack of Transportation (Non-Medical): No  Physical Activity: Inactive (03/04/2018)   Exercise Vital Sign    Days of Exercise per Week: 0 days    Minutes of Exercise per Session: 0 min  Stress: No Stress Concern Present (03/04/2018)   [place]    Feeling of Stress : Not at all  Social Connections: Somewhat Isolated (03/04/2018)   Social Connection and Isolation Panel [NHANES]    Frequency of Communication with Friends and Family: More than three times a week    Frequency of Social Gatherings with Friends and Family: Once a week    Attends Religious Services: More than 4 times per year    Active Member of [employer] or Organizations: No    Attends Archivist Meetings: Never    Marital Status: Divorced    Allergies:  Allergies  Allergen  Reactions   Hydralazine Hcl Itching   Erythromycin Nausea Only   Hydralazine Itching   Hydrochlorothiazide Itching   Vortioxetine Nausea Only    Metabolic Disorder Labs: No results found for: "HGBA1C", "MPG" No results found for: "PROLACTIN" No results found for: "CHOL", "TRIG", "HDL", "CHOLHDL", "VLDL", "LDLCALC" No results found for: "TSH"  Therapeutic Level Labs: No results found for: "LITHIUM" No results found for: "VALPROATE" No results found for: "CBMZ"  Current Medications: Current Outpatient Medications  Medication Sig Dispense Refill   dicyclomine (BENTYL) 10 MG capsule Take 1 capsule (10 mg total) by mouth  every 6 (six) hours as needed for spasms. 30 capsule 0   FLUoxetine (PROZAC) 10 MG capsule TAKE 1 CAPSULE BY MOUTH EVERY DAY 90 capsule 0   levothyroxine (SYNTHROID) 50 MCG tablet Take 1.5 tablets by mouth daily. Takes differently     lisinopril (ZESTRIL) 20 MG tablet Take 30 mg by mouth daily.     loratadine (CLARITIN) 10 MG tablet Take 10 mg by mouth daily.     Multiple Vitamin (MULTI-VITAMIN) tablet Take 1 tablet by mouth daily.     Pancrelipase, Lip-Prot-Amyl, (ZENPEP) 40000-126000 units CPEP Take 2 capsules with the first bite of each meal and 1 capsule with the first bite of each snack 240 capsule 0   pantoprazole (PROTONIX) 40 MG tablet Take 40 mg by mouth daily.     simvastatin (ZOCOR) 40 MG tablet Take 40 mg by mouth at bedtime.     buPROPion (WELLBUTRIN XL) 300 MG 24 hr tablet Take 1 tablet (300 mg total) by mouth daily. 90 tablet 1   ezetimibe (ZETIA) 10 MG tablet Take 1 tablet by mouth daily.     No current facility-administered medications for this visit.     Musculoskeletal: Strength & Muscle Tone:  UTA Gait & Station:  Seated Patient leans: N/A  Psychiatric Specialty Exam: Review of Systems  Psychiatric/Behavioral:  The patient is nervous/anxious.   All other systems reviewed and are negative.   There were no vitals taken for this visit.There  is no height or weight on file to calculate BMI.  General Appearance: Casual  Eye Contact:  Fair  Speech:  Normal Rate  Volume:  Normal  Mood:  Anxious coping well  Affect:  Congruent  Thought Process:  Goal Directed and Descriptions of Associations: Intact  Orientation:  Full (Time, Place, and Person)  Thought Content: Logical   Suicidal Thoughts:  No  Homicidal Thoughts:  No  Memory:  Immediate;   Fair Recent;   Fair Remote;   Fair  Judgement:  Fair  Insight:  Fair  Psychomotor Activity:  Normal  Concentration:  Concentration: Fair and Attention Span: Fair  Recall:  [employer] of Knowledge: Fair  Language: Fair  Akathisia:  No  Handed:  Right  AIMS (if indicated): not done  Assets:  Communication Skills Desire for Improvement Housing Social Support  ADL's:  Intact  Cognition: WNL  Sleep:  Fair   Screenings: [place] Visit from 04/10/2022 in [place] Total Score 0      [place] Office Visit from 07/17/2022 in [place] Office Visit from 08/16/2021 in [place]  Total GAD-7 Score 10 10      PHQ2-9    [place] Office Visit from 07/17/2022 in [place] Office Visit from 04/10/2022 in [place] Office Visit from 11/21/2021 in [place] Office Visit from 08/16/2021 in [place] Counselor from 07/19/2021 in [place]  PHQ-2 Total Score 2 3 2 1  0  PHQ-9 Total Score 11 11 11 13  --      Flowsheet Row Video Visit from 12/31/2022 in [place] Admission (Discharged) from 09/13/2022 in [place] Office Visit from 07/17/2022 in  [place] No Risk  No Risk No Risk        Assessment and Plan: Julie Harrison is a 72 year old Caucasian female who has a history of bipolar disorder, GAD, hypothyroidism was evaluated by telemedicine today.  Patient is currently stable.  Plan as noted below.  Plan Bipolar disorder type II in remission Wellbutrin XL 300 mg p.o. daily Continue Prozac 10 mg p.o. daily   GAD-improving Patient advised to establish care with therapist-provided resources in the past.  Patient reports she does have resources with her and is motivated to get established. Prozac 10 mg p.o. daily-changed to tablet due to her history of GI issues .  Social anxiety disorder-stable Continue to monitor  Primary insomnia-improving Continue sleep hygiene techniques  Reviewed and discussed labs-TSH-dated 12/04/2022-within normal limits, CMP-sodium level-within normal limits.  Follow-up in clinic in 4 - 5 months or sooner in person.  Collaboration of Care: Collaboration of Care: Other reviewed labs as noted above.  Patient advised to establish care with therapist.  Patient/Guardian was advised Release of Information must be obtained prior to any record release in order to collaborate their care with an outside provider. Patient/Guardian was advised if they have not already done so to contact the registration department to sign all necessary forms in order for [place] to release information regarding their care.   Consent: Patient/Guardian gives verbal consent for treatment and assignment of benefits for services provided during this visit. Patient/Guardian expressed understanding and agreed to proceed.  This note was generated in part or whole with voice recognition software. Voice recognition is usually quite accurate but there are transcription errors that can and very often do occur. I apologize for any typographical errors that were not detected and  corrected.     Ursula Alert, MD 01/01/2023, 2:18 PM

## 2023-01-14 IMAGING — CT CT CHEST LUNG CANCER SCREENING LOW DOSE W/O CM
2 of 5 series · 15 of 40 positions shown, 18 images · non-contrast
Comparison: None.

CLINICAL DATA: Lung cancer screening.  Asymptomatic former smoker.

EXAM:
CT CHEST WITHOUT CONTRAST LOW-DOSE FOR LUNG CANCER SCREENING
TECHNIQUE: Multidetector CT imaging of the chest was performed following the
standard protocol without IV contrast.

[Series 3: lung 1.00 · axial · 0.58mm/px · z∈[-1116,-873]mm · 12 of 269 slices shown, 15 images]
[im 13/269  mediastinal]
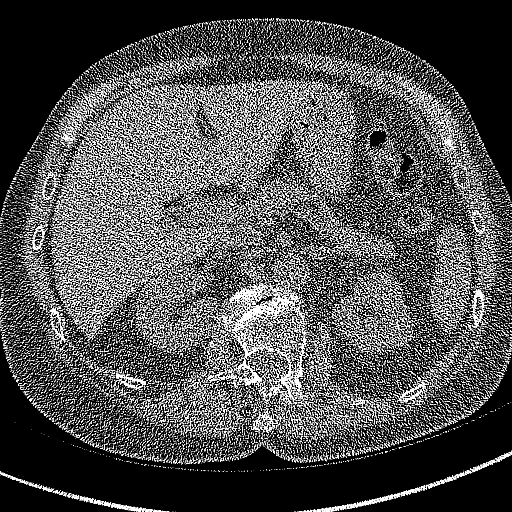
[im 13/269  lung]
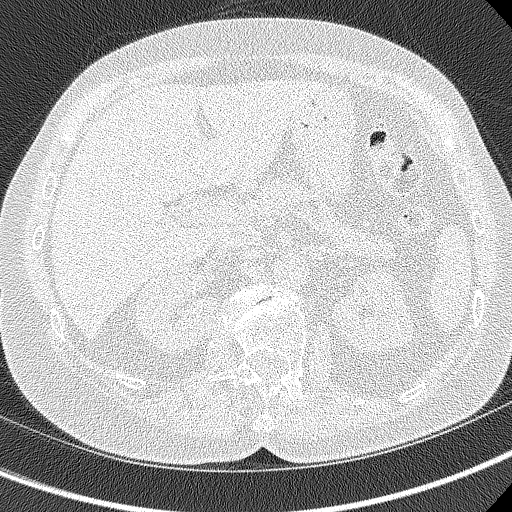
[im 37/269  lung]
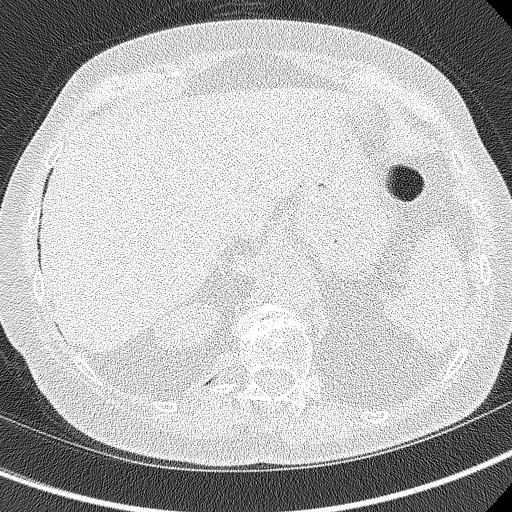
[im 61/269  lung]
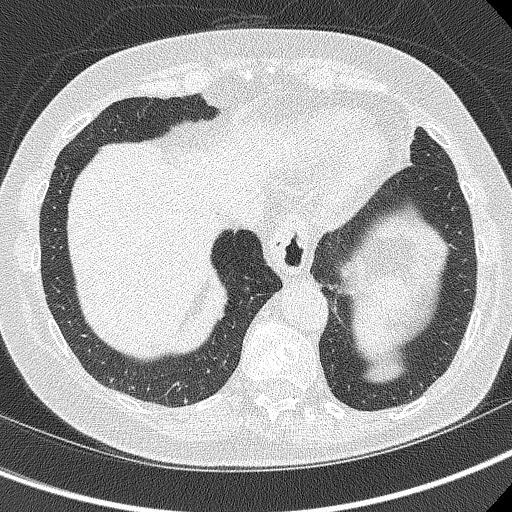
[im 86/269  lung]
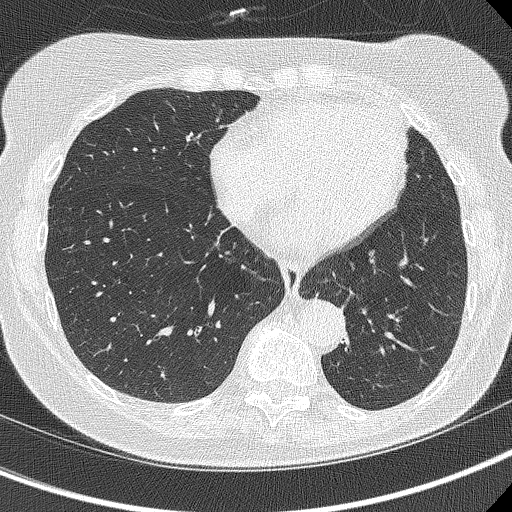
[im 98/269  mediastinal]
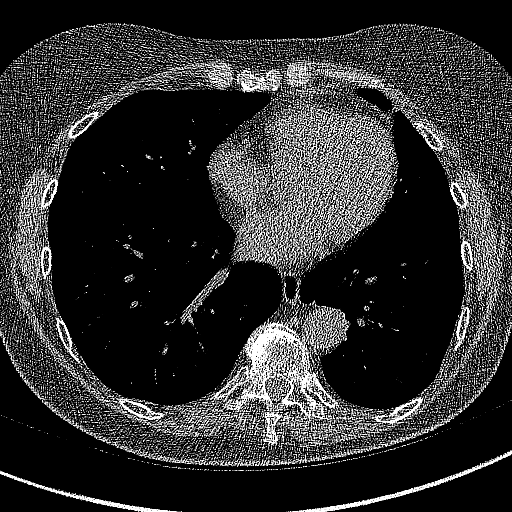
[im 98/269  lung]
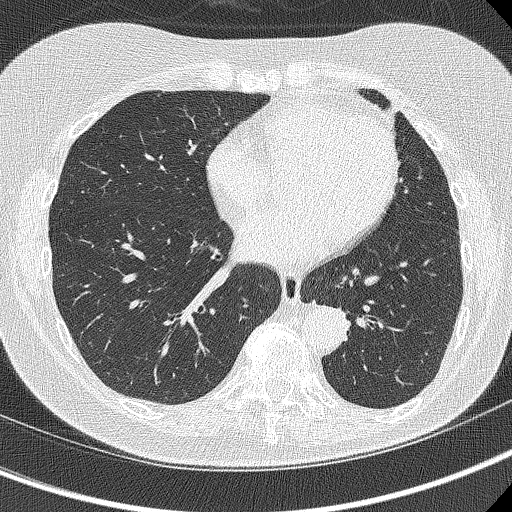
[im 122/269  lung]
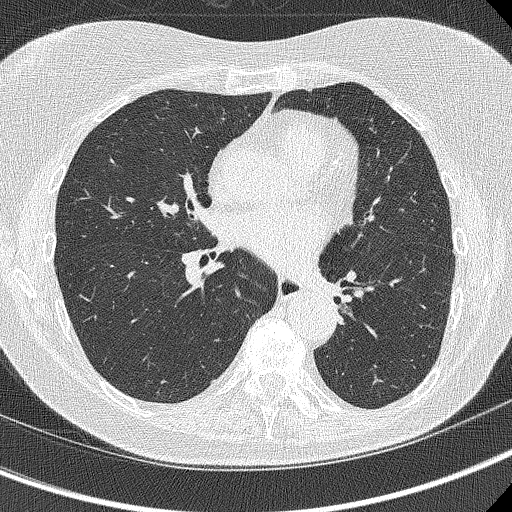
[im 147/269  lung]
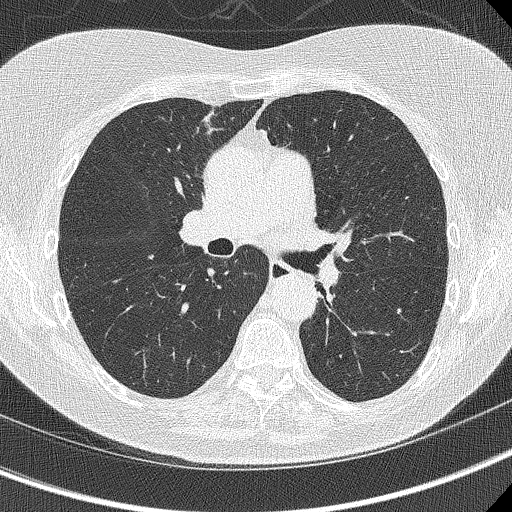
[im 171/269  lung]
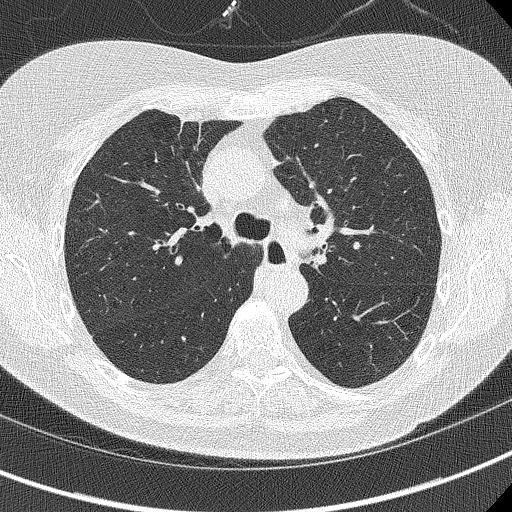
[im 183/269  mediastinal]
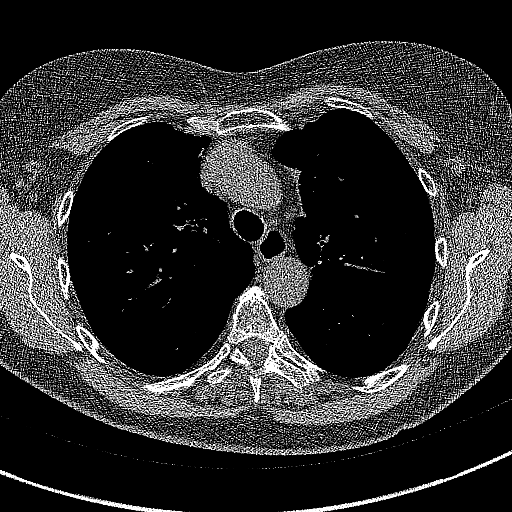
[im 183/269  lung]
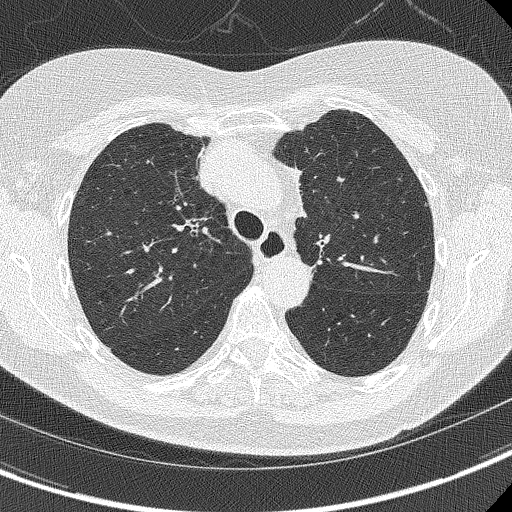
[im 208/269  lung]
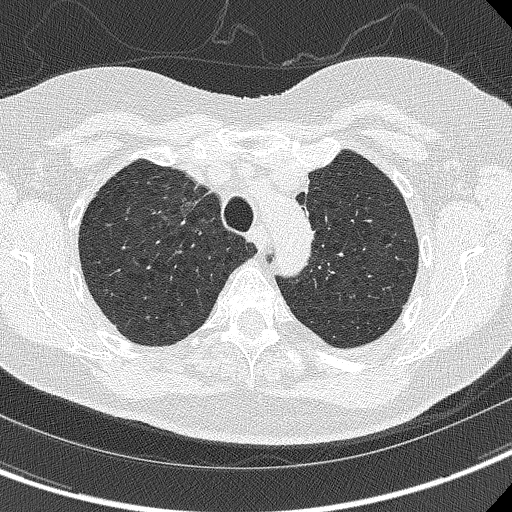
[im 232/269  lung]
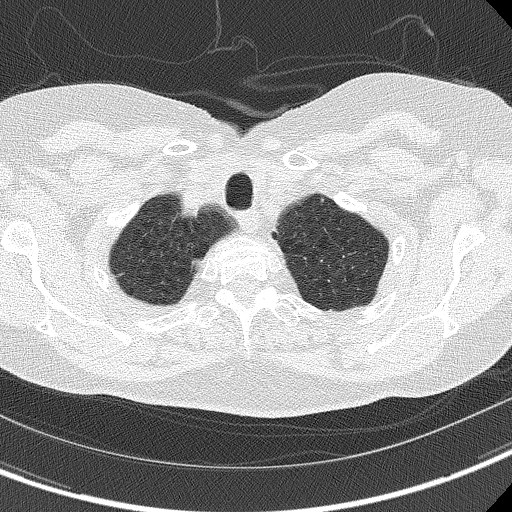
[im 256/269  lung]
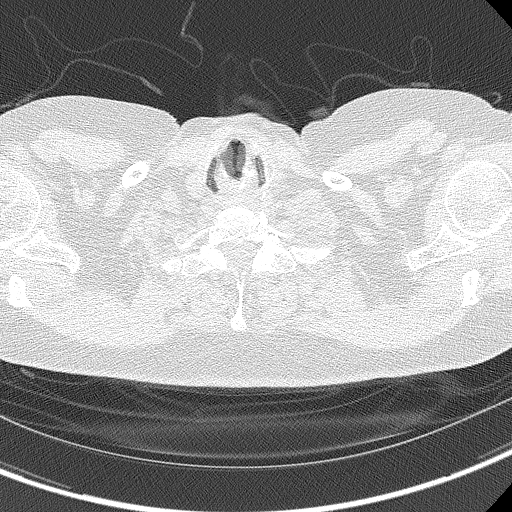

[Series 5: coronals lung 1.00 cor · coronal · 0.53mm/px · 3 of 253 slices shown]
[im 51/253  lung]
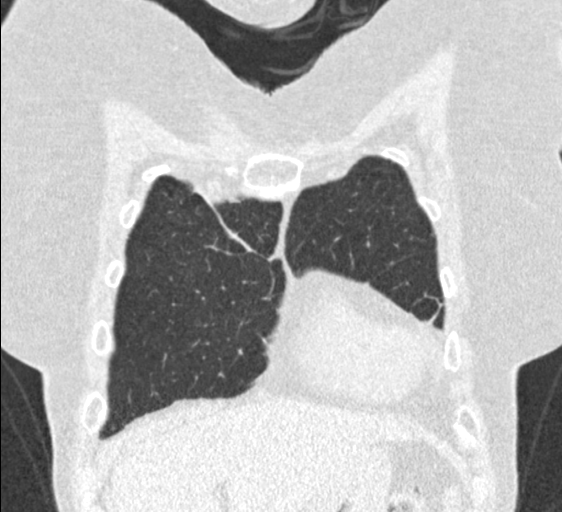
[im 101/253  lung]
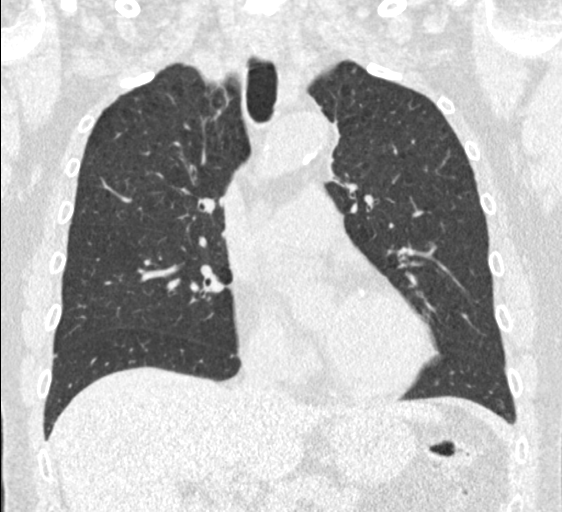
[im 152/253  lung]
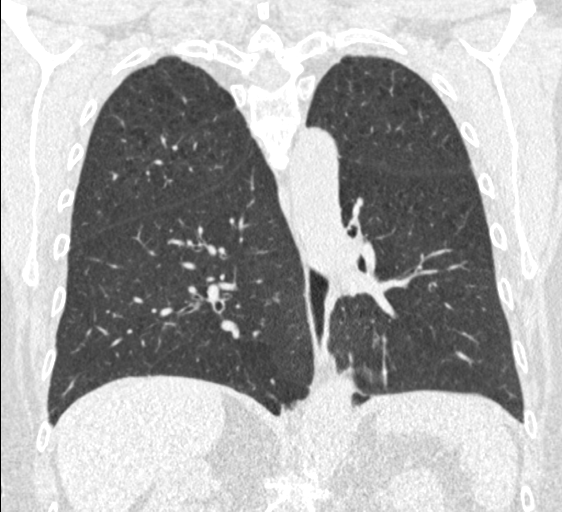

[15 of 40 positions shown; findings below may reference images not displayed]

FINDINGS: Cardiovascular: Normal heart size. Aortic atherosclerosis. Coronary
artery atherosclerotic calcifications.

Mediastinum/Nodes: No enlarged mediastinal, hilar, or axillary lymph
nodes. Thyroid gland, trachea, and esophagus demonstrate no
significant findings.

Lungs/Pleura: Moderate centrilobular and paraseptal emphysema. No
pleural effusion, airspace consolidation, or atelectasis. Scattered
areas of subpleural banding identified bilaterally. Several small
bilateral lung nodules are identified. The largest appears non solid
in is in the lateral left lung base with a mean derived diameter of
6.9 mm. No suspicious lung nodules identified at this time.

Upper Abdomen: No acute abnormality. Small hiatal hernia. Aortic
atherosclerotic calcifications.

Musculoskeletal: No acute or suspicious findings. Thoracic
degenerative disc disease noted.
IMPRESSION: 1. Lung-RADS 2, benign appearance or behavior. Continue annual
screening with low-dose chest CT without contrast in 12 months.
2. Coronary artery calcifications.
3. Hiatal hernia
4. Aortic Atherosclerosis ([id number]) and Emphysema ([id number]).

## 2023-02-11 ENCOUNTER — Telehealth: Payer: Self-pay

## 2023-02-11 DIAGNOSIS — F4010 Social phobia, unspecified: Secondary | ICD-10-CM

## 2023-02-11 DIAGNOSIS — F4321 Adjustment disorder with depressed mood: Secondary | ICD-10-CM

## 2023-02-11 DIAGNOSIS — F411 Generalized anxiety disorder: Secondary | ICD-10-CM

## 2023-02-11 DIAGNOSIS — F3176 Bipolar disorder, in full remission, most recent episode depressed: Secondary | ICD-10-CM

## 2023-02-11 NOTE — Telephone Encounter (Signed)
  Received fax requesting a refill on the fluoxetine.  Pt was last seen on 3-18.  Next appt  8-30   FLUoxetine (PROZAC) 10 MG capsule Medication Date: 11/26/2022 Department: [facility] Ordering/Authorizing: Jomarie Longs, MD   Order Providers  Prescribing Provider Encounter Provider  Jomarie Longs, MD Jomarie Longs, MD   Outpatient Medication Detail   Disp Refills Start End   FLUoxetine (PROZAC) 10 MG capsule 90 capsule 0 11/26/2022    Sig - Route: TAKE 1 CAPSULE BY MOUTH EVERY DAY - Oral   Sent to pharmacy as: FLUoxetine (PROZAC) 10 MG capsule   E-Prescribing Status: Receipt confirmed by pharmacy (11/26/2022 10:43 AM EST)

## 2023-02-11 NOTE — Telephone Encounter (Signed)
Patient's fluoxetine was last sent to [facility] in [place].  Will have CMA contact patient to clarify.

## 2023-02-13 NOTE — Telephone Encounter (Signed)
left message asking patient to call our office with which pharmacy she needs medications sent to .

## 2023-02-13 NOTE — Telephone Encounter (Signed)
Please let me know once we find out.

## 2023-02-18 MED ORDER — BUPROPION HCL ER (XL) 300 MG PO TB24: 300.0000 mg | ORAL_TABLET | Freq: Every day | ORAL | 1 refills | Status: DC

## 2023-02-18 MED ORDER — FLUOXETINE HCL 10 MG PO CAPS: 10.0000 mg | ORAL_CAPSULE | Freq: Every day | ORAL | 1 refills | Status: DC

## 2023-02-18 NOTE — Telephone Encounter (Signed)
pt states she is transfering [facility] to [facility]. so she needs the medication sent to the [facility]

## 2023-02-18 NOTE — Telephone Encounter (Signed)
I have sent fluoxetine and Wellbutrin to [facility].

## 2023-02-19 NOTE — Telephone Encounter (Signed)
Pt.notified

## 2023-02-20 ENCOUNTER — Other Ambulatory Visit: Payer: Self-pay | Admitting: Psychiatry

## 2023-02-20 DIAGNOSIS — F4321 Adjustment disorder with depressed mood: Secondary | ICD-10-CM

## 2023-02-20 DIAGNOSIS — F3176 Bipolar disorder, in full remission, most recent episode depressed: Secondary | ICD-10-CM

## 2023-02-20 DIAGNOSIS — F411 Generalized anxiety disorder: Secondary | ICD-10-CM

## 2023-02-20 DIAGNOSIS — F4010 Social phobia, unspecified: Secondary | ICD-10-CM

## 2023-04-09 ENCOUNTER — Other Ambulatory Visit: Payer: Self-pay | Admitting: Internal Medicine

## 2023-04-09 DIAGNOSIS — Z1231 Encounter for screening mammogram for malignant neoplasm of breast: Secondary | ICD-10-CM

## 2023-04-16 ENCOUNTER — Ambulatory Visit
Admission: RE | Admit: 2023-04-16 | Discharge: 2023-04-16 | Disposition: A | Discharge status: Home / Self Care | Payer: Medicare Other | Source: Ambulatory Visit | Attending: Internal Medicine | Admitting: Internal Medicine

## 2023-04-16 DIAGNOSIS — Z1231 Encounter for screening mammogram for malignant neoplasm of breast: Secondary | ICD-10-CM | POA: Diagnosis present

## 2023-05-02 ENCOUNTER — Other Ambulatory Visit: Payer: Self-pay

## 2023-05-06 ENCOUNTER — Encounter: Payer: Self-pay | Admitting: Gastroenterology

## 2023-05-06 ENCOUNTER — Ambulatory Visit: Payer: Medicare Other | Admitting: Gastroenterology

## 2023-05-06 VITALS — BP 127/65 | HR 63 | Temp 98.1°F | Ht 59.0 in | Wt 112.2 lb

## 2023-05-06 DIAGNOSIS — K8681 Exocrine pancreatic insufficiency: Secondary | ICD-10-CM | POA: Diagnosis not present

## 2023-05-06 DIAGNOSIS — Z91018 Allergy to other foods: Secondary | ICD-10-CM | POA: Diagnosis not present

## 2023-05-06 NOTE — Progress Notes (Signed)
Julie Repress, MD [address]  [address]  [place], [place] [address]  Main: [phone]  Fax: [phone]    Gastroenterology Consultation  Referring Provider:     Enid Baas, MD Primary Care Physician:  Enid Baas, MD Primary Gastroenterologist:  Dr. Arlyss Harrison Reason for Consultation: EPI and alpha gal deficiency   HPI:   Julie Harrison is a 72 y.o. female referred by Dr. Enid Baas, MD  for consultation & management of abdominal cramps and diarrhea.  Patient has several years history of postprandial urgency with loose stools associated with abdominal cramps, bloating.  She was originally evaluated by Dr. Norma Fredrickson at [facility], underwent upper endoscopy as well as colonoscopy which were unremarkable except for mild erosive esophagitis.  Random colon biopsies were normal.  Patient felt like her symptoms were not addressed and she followed up at [facility].  She underwent sits marker study at [facility] which revealed moderate stool burden and retention of sitz markers.  Subsequently, she was recommended to undergo anorectal manometry and she did not pursue it at that time.  Patient does have history of anxiety and she notices anxiety triggers her GI symptoms.  She does drink sodas, sweet tea regularly as well as consumes red meat few times a week.  Her weight has been stable.  Her labs including CBC, CMP were unremarkable, normal HbA1c.  She does have history of hypothyroidism, on thyroid replacement therapy   Patient does not smoke or drink alcohol Ex tobacco use   Follow-up visit 08/01/2022 Patient is here for follow up of chronic diarrhea, abdominal bloating.  Patient reports that she has been experiencing intermittent episodes of abdominal cramps with loose stools depending on what she eats.  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  When she is needed these food products, she felt better.  When she  reintroduced, she noticed recurrence of symptoms.  She is also interested to undergo repeat testing for EPI.  Follow-up visit 05/06/2023 Patient is here for follow-up of EPI.  Repeat pancreatic fecal elastase levels came back low.  I recommended her to start taking pancreatic enzymes which she has been but not consistently.  She recently lost her mom who was 20 years old in May and has been a big adjustment for her.  She lost about 10 pounds since she passed away, for last few weeks, her weight has been stable.  She does notice improvement in her symptoms when she is consistently taking pancreatic enzymes.  She does not have major concerns today.   NSAIDs: None  Antiplts/Anticoagulants/Anti thrombotics: None  GI Procedures:   Colonoscopy 09/13/2022 Normal terminal ileum Granular distal rectum biopsy Normal mucosa in the right and left colon, biopsied Severe left colon diverticulosis DIAGNOSIS:  A. COLON, RANDOM; COLD BIOPSY:  - BENIGN COLONIC MUCOSA WITH NO SIGNIFICANT HISTOPATHOLOGIC CHANGE.  - NEGATIVE FOR ACTIVE MUCOSAL COLITIS AND FEATURES OF MICROSCOPIC  COLITIS.  - NEGATIVE FOR DYSPLASIA AND MALIGNANCY.   B. RECTUM, DISTAL; COLD BIOPSY:  - BENIGN RECTAL MUCOSA WITH ULCERATION, ACTIVE INFLAMMATION, AND FOCAL  MUCOSAL PROLAPSE TYPE CHANGES.  - NEGATIVE FOR DYSPLASIA AND MALIGNANCY.   -Colonoscopy:08/05/2017 - Normal random colon bx, TA polyp x 1, diverticulosis, internal hemorroids - EGD: 08/05/2017 - Gastritis, no h pylori, LA Grade A esophagitis  Sits marker study at [facility] 12/16/2019 FINDINGS:  Thirty-one Sitz markers are noted throughout the colon. The distal  most marker is likely  at the level of the distal sigmoid colon. The  bowel gas pattern is nonobstructive. There is a moderate amount of  stool throughout the colon. There are degenerative changes of the  visualized thoracolumbar spine and bilateral hips, left worse than  right.   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 [facility])    Personal history of colonic polyps    Thyroid disease    Tobacco dependence    Vitamin D deficiency     Past Surgical History:  Procedure Laterality Date   COLONOSCOPY WITH PROPOFOL N/A 09/13/2022   Procedure: COLONOSCOPY WITH PROPOFOL;  Surgeon: Toney Reil, MD;  Location: [facility] ENDOSCOPY;  Service: Gastroenterology;  Laterality: N/A;    Current Outpatient Medications:    buPROPion (WELLBUTRIN XL) 300 MG 24 hr tablet, Take 1 tablet (300 mg total) by mouth daily., Disp: 90 tablet, Rfl: 1   dicyclomine (BENTYL) 10 MG capsule, Take 1 capsule (10 mg total) by mouth every 6 (six) hours as needed for spasms., Disp: 30 capsule, Rfl: 0   FLUoxetine (PROZAC) 10 MG capsule, Take 1 capsule (10 mg total) by mouth daily., Disp: 90 capsule, Rfl: 1   levothyroxine (SYNTHROID) 75 MCG tablet, Take 75 mcg by mouth every morning., Disp: , Rfl:    lisinopril (ZESTRIL) 10 MG tablet, Take 10 mg by mouth daily., Disp: , Rfl:    lisinopril (ZESTRIL) 20 MG tablet, Take 20 mg by mouth daily., Disp: , Rfl:    loratadine (CLARITIN) 10 MG tablet, Take 10 mg by mouth daily., Disp: , Rfl:    Multiple Vitamin (MULTI-VITAMIN) tablet, Take 1 tablet by mouth daily., Disp: , Rfl:    Pancrelipase, Lip-Prot-Amyl, (ZENPEP) 40000-126000 units CPEP, Take 2 capsules with the first bite of each meal and 1 capsule with the first bite of each snack, Disp: 240 capsule, Rfl: 0   pantoprazole (PROTONIX) 40 MG tablet, Take 40 mg by mouth daily., Disp: , Rfl:    simvastatin (ZOCOR) 40 MG tablet, Take 40 mg by mouth at bedtime., Disp: , Rfl:    triamcinolone cream (KENALOG) 0.1 %, Apply 1 Application topically 2 (two) times daily., Disp: , Rfl:    calcium carbonate (SUPER CALCIUM) 1500 (600 Ca) MG TABS tablet, Take 600 mg of elemental calcium by mouth daily with breakfast. (Patient not taking: Reported on 05/06/2023),  Disp: , Rfl:    Cholecalciferol 10 MCG (400 UNIT) CHEW, Chew by mouth. (Patient not taking: Reported on 05/06/2023), Disp: , Rfl:    ezetimibe (ZETIA) 10 MG tablet, Take 1 tablet by mouth daily., Disp: , Rfl:    Family History  Problem Relation Age of Onset   Depression Mother    Alcohol abuse Father    Breast cancer Maternal Aunt 10   Alcohol abuse Paternal Uncle    Depression Maternal Grandmother    Breast cancer Maternal Grandmother 35   Drug abuse Cousin      Social History   Tobacco Use   Smoking status: Former    Current packs/day: 0.00    Types: Cigarettes    Quit date: 03/22/2018    Years since quitting: 5.1   Smokeless tobacco: Never  Vaping Use   Vaping status: Former  Substance Use Topics   Alcohol use: Yes    Alcohol/week: 2.0 standard drinks of alcohol    Types: 2 Glasses of wine per week   Drug use: Yes  Types: Marijuana    Comment: about a month ago    Allergies as of 05/06/2023 - Review Complete 12/31/2022  Allergen Reaction Noted   Hydralazine hcl Itching 07/12/2017   Erythromycin Nausea Only 11/08/2016   Hydralazine Itching 07/12/2017   Hydrochlorothiazide Itching 11/08/2016   Vortioxetine Nausea Only 11/08/2016    Review of Systems:    All systems reviewed and negative except where noted in HPI.   Physical Exam:  BP 127/65 (BP Location: Right Arm, Patient Position: Sitting, Cuff Size: Normal)   Pulse 63   Temp 98.1 F (36.7 C) (Oral)   Ht 4\' 11"  (1.499 m)   Wt 112 lb 4 oz (50.9 kg)   BMI 22.67 kg/m  No LMP recorded. Patient is postmenopausal.  General:   Alert,  Well-developed, well-nourished, pleasant and cooperative in NAD Head:  Normocephalic and atraumatic. Eyes:  Sclera clear, no icterus.   Conjunctiva pink. Ears:  Normal auditory acuity. Nose:  No deformity, discharge, or lesions. Mouth:  No deformity or lesions,oropharynx pink & moist. Neck:  Supple; no masses or thyromegaly. Lungs:  Respirations even and unlabored.  Clear  throughout to auscultation.   No wheezes, crackles, or rhonchi. No acute distress. Heart:  Regular rate and rhythm; no murmurs, clicks, rubs, or gallops. Abdomen:  Normal bowel sounds. Soft, non-tender and nondistended without masses, hepatosplenomegaly or hernias noted.  No guarding or rebound tenderness.   Rectal: Not performed Msk:  Symmetrical without gross deformities. Good, equal movement & strength bilaterally. Pulses:  Normal pulses noted. Extremities:  No clubbing or edema.  No cyanosis. Neurologic:  Alert and oriented x3;  grossly normal neurologically. Skin:  Intact without significant lesions or rashes. No jaundice. Psych:  Alert and cooperative. Normal mood and affect.  Imaging Studies: Reviewed  Assessment and Plan:   Chaney Ingram is a 72 y.o. pleasant Caucasian female with history of anxiety, depression, history of hypothyroidism, hypertension, history of tobacco use on Wellbutrin is seen in consultation for chronic symptoms of abdominal bloating, abdominal cramps as well as postprandial urgency, loose stools without rectal bleeding. EGD and colonoscopy were unremarkable, no evidence of H. pylori, random colon biopsies were normal.  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  Food allergy profile was positive for milk allergy, alpha gal panel was positive for elevated IgE levels to beef, pork and lab.  Patient reports history of Lyme's disease several years ago after a tick bite.   Chronic diarrhea and abdominal bloating secondary to alpha gal and milk allergy, exocrine pancreatic insufficiency Her symptoms responded to pancreatic enzyme replacement therapy Reiterated about adherence to pancreatic enzymes Maintain weight log and call our office back if she continues to lose weight   Follow up in 6 months   Julie Repress, MD

## 2023-06-14 ENCOUNTER — Ambulatory Visit (INDEPENDENT_AMBULATORY_CARE_PROVIDER_SITE_OTHER): Payer: Medicare Other | Admitting: Psychiatry

## 2023-06-14 ENCOUNTER — Encounter: Payer: Self-pay | Admitting: Psychiatry

## 2023-06-14 VITALS — BP 129/76 | HR 69 | Temp 97.5°F | Ht 59.0 in | Wt 114.6 lb

## 2023-06-14 DIAGNOSIS — G2581 Restless legs syndrome: Secondary | ICD-10-CM | POA: Insufficient documentation

## 2023-06-14 DIAGNOSIS — F3176 Bipolar disorder, in full remission, most recent episode depressed: Secondary | ICD-10-CM | POA: Diagnosis not present

## 2023-06-14 DIAGNOSIS — F4010 Social phobia, unspecified: Secondary | ICD-10-CM

## 2023-06-14 DIAGNOSIS — F411 Generalized anxiety disorder: Secondary | ICD-10-CM

## 2023-06-14 DIAGNOSIS — G4709 Other insomnia: Secondary | ICD-10-CM

## 2023-06-14 DIAGNOSIS — Z634 Disappearance and death of family member: Secondary | ICD-10-CM | POA: Insufficient documentation

## 2023-06-14 MED ORDER — ROPINIROLE HCL 0.25 MG PO TABS: 0.2500 mg | ORAL_TABLET | Freq: Every day | ORAL | 1 refills | Status: DC

## 2023-06-14 NOTE — Progress Notes (Signed)
BH MD OP Progress Note  06/14/2023 12:51 PM Julie Harrison  MRN:  [id number]  Chief Complaint:  Chief Complaint  Patient presents with   Follow-up   Anxiety   Depression   Medication Refill   HPI: Julie Harrison is a 72 year old Caucasian female, married, retired, lives in [place], has a history of bipolar disorder, GAD, social anxiety disorder, hypothyroidism, hypertension was evaluated in office today.  Patient today reports she just lost her mother the end of May.  Her mother was 60 years old.  She died in her sleep.  Patient reports she hence has been grieving.  It does not feel the same anymore.  She also has been busy doing funeral arrangements, and currently taking care of her estate.  Patient reports that it caused her some sadness, decreased appetite, intrusive memories about the past, mostly positive, the time she had spent with her mother, sadness regarding her mother's home which is going to be sold.  Patient reports she does also have restless leg symptoms which is affecting her sleep at night.  Patient reports she may have taken Requip in the past however stopped taking it since she may have been on a higher dosage and that could have caused some lightheadedness.  She would like to try it again.  Patient also is interested in starting psychotherapy again.  Patient denies any suicidality, homicidality or perceptual disturbances.  Patient is compliant on medications like Prozac, Wellbutrin.  Denies side effects.  Patient denies any other concerns today.  Visit Diagnosis:    ICD-10-CM   1. Bipolar disorder, in full remission, most recent episode depressed (HCC)  F31.76     2. GAD (generalized anxiety disorder)  F41.1     3. Social anxiety disorder  F40.10     4. Other insomnia  G47.09 rOPINIRole (REQUIP) 0.25 MG tablet   likely due to RLS    5. RLS (restless legs syndrome)  G25.81 rOPINIRole (REQUIP) 0.25 MG tablet    6. Bereavement  Z63.4       Past  Psychiatric History: I have reviewed past psychiatric history from progress note on 03/04/2018.  Past trials of Viibryd, duloxetine, Pristiq, Zoloft, Lexapro, Latuda, Prozac, Brintellix.  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  Family History:  Family History  Problem Relation Age of Onset   Depression Mother    Alcohol abuse Father    Breast cancer Maternal Aunt 46   Alcohol abuse Paternal Uncle    Depression Maternal Grandmother    Breast cancer Maternal Grandmother 26   Drug abuse Cousin     Social History: I have reviewed social history from progress note on 03/04/2018. Social History   Socioeconomic History   Marital status: Married    Spouse name: Not on file   Number of children: 2   Years of education: Not on file   Highest education level: Associate degree: occupational,  technical, or vocational program  Occupational History    Comment: retired  Tobacco Use   Smoking status: Former    Current packs/day: 0.00    Types: Cigarettes    Quit date: 03/22/2018    Years since quitting: 5.2   Smokeless tobacco: Never  Vaping Use   Vaping status: Former  Substance and Sexual Activity   Alcohol use: Yes    Alcohol/week: 2.0 standard drinks of alcohol    Types: 2 Glasses of wine per week   Drug use: Yes    Types: Marijuana    Comment: about a month ago   Sexual activity: Yes  Other Topics  Concern   Not on file  Social History Narrative   Not on file   Social Determinants of Health   Financial Resource Strain: Low Risk  (03/04/2018)   Overall Financial Resource Strain (CARDIA)    Difficulty of Paying Living Expenses: Not hard at all  Food Insecurity: No Food Insecurity (03/04/2018)   Hunger Vital Sign    Worried About Running Out of Food in the Last Year: Never true    Ran Out of Food in the Last Year: Never true  Transportation Needs: No Transportation Needs (03/04/2018)   PRAPARE - Administrator, Civil Service (Medical): No    Lack of Transportation (Non-Medical): No  Physical Activity: Inactive (03/04/2018)   Exercise Vital Sign    Days of Exercise per Week: 0 days    Minutes of Exercise per Session: 0 min  Stress: No Stress Concern Present (03/04/2018)   [employer] of Occupational Health - Occupational Stress Questionnaire    Feeling of Stress : Not at all  Social Connections: Somewhat Isolated (03/04/2018)   Social Connection and Isolation Panel [NHANES]    Frequency of Communication with Friends and Family: More than three times a week    Frequency of Social Gatherings with Friends and Family: Once a week    Attends Religious Services: More than 4 times per year    Active Member of [employer] or Organizations: No    Attends Banker Meetings: Never    Marital Status: Divorced    Allergies:  Allergies  Allergen Reactions   Hydralazine Hcl Itching   Erythromycin Nausea Only   Hydralazine Itching   Hydrochlorothiazide Itching   Vortioxetine Nausea Only    Metabolic Disorder Labs: No results found for: "HGBA1C", "MPG" No results found for: "PROLACTIN" No results found for: "CHOL", "TRIG", "HDL", "CHOLHDL", "VLDL", "LDLCALC" No results found for: "TSH"  Therapeutic Level Labs: No results found for: "LITHIUM" No results found for: "VALPROATE" No results found for: "CBMZ"  Current Medications: Current Outpatient Medications   Medication Sig Dispense Refill   buPROPion (WELLBUTRIN XL) 300 MG 24 hr tablet Take 1 tablet (300 mg total) by mouth daily. 90 tablet 1   calcium carbonate (SUPER CALCIUM) 1500 (600 Ca) MG TABS tablet Take 600 mg of elemental calcium by mouth daily with breakfast.     Cholecalciferol 10 MCG (400 UNIT) CHEW Chew by mouth.     dicyclomine (BENTYL) 10 MG capsule Take 1 capsule (10 mg total) by mouth every 6 (six) hours as needed for spasms. 30 capsule 0   FLUoxetine (PROZAC) 10 MG capsule Take 1 capsule (10 mg total) by mouth daily. 90 capsule 1   levothyroxine (SYNTHROID) 75 MCG tablet Take 75 mcg by mouth every morning.     lisinopril (ZESTRIL) 10 MG tablet Take 10 mg by mouth daily.  lisinopril (ZESTRIL) 20 MG tablet Take 20 mg by mouth daily.     loratadine (CLARITIN) 10 MG tablet Take 10 mg by mouth daily.     Multiple Vitamin (MULTI-VITAMIN) tablet Take 1 tablet by mouth daily.     Pancrelipase, Lip-Prot-Amyl, (ZENPEP) 40000-126000 units CPEP Take 2 capsules with the first bite of each meal and 1 capsule with the first bite of each snack 240 capsule 0   pantoprazole (PROTONIX) 40 MG tablet Take 40 mg by mouth daily.     rOPINIRole (REQUIP) 0.25 MG tablet Take 1 tablet (0.25 mg total) by mouth at bedtime. 30 tablet 1   simvastatin (ZOCOR) 40 MG tablet Take 40 mg by mouth at bedtime.     triamcinolone cream (KENALOG) 0.1 % Apply 1 Application topically 2 (two) times daily.     ezetimibe (ZETIA) 10 MG tablet Take 1 tablet by mouth daily.     No current facility-administered medications for this visit.     Musculoskeletal: Strength & Muscle Tone: within normal limits Gait & Station: normal Patient leans: N/A  Psychiatric Specialty Exam: Review of Systems  Psychiatric/Behavioral:  Positive for sleep disturbance.        Grief     Blood pressure 129/76, pulse 69, temperature (!) 97.5 F (36.4 C), temperature source Skin, height 4\' 11"  (1.499 m), weight 114 lb 9.6 oz (52 kg).Body  mass index is 23.15 kg/m.  General Appearance: Casual  Eye Contact:  Fair  Speech:  Clear and Coherent  Volume:  Normal  Mood:   grief  Affect:  Appropriate  Thought Process:  Goal Directed and Descriptions of Associations: Intact  Orientation:  Full (Time, Place, and Person)  Thought Content: Logical   Suicidal Thoughts:  No  Homicidal Thoughts:  No  Memory:  Immediate;   Fair Recent;   Fair Remote;   Fair  Judgement:  Fair  Insight:  Fair  Psychomotor Activity:  Normal  Concentration:  Concentration: Fair and Attention Span: Fair  Recall:  [employer] of Knowledge: Fair  Language: Fair  Akathisia:  No  Handed:  Right  AIMS (if indicated): not done  Assets:  Communication Skills Desire for Improvement Housing Social Support  ADL's:  Intact  Cognition: WNL  Sleep:   restless    Screenings: Geneticist, molecular Office Visit from 04/10/2022 in [facility]  AIMS Total Score 0      GAD-7    Flowsheet Row Office Visit from 06/14/2023 in [facility] Office Visit from 07/17/2022 in [facility] Office Visit from 08/16/2021 in [facility]  Total GAD-7 Score 20 10 10       PHQ2-9    Flowsheet Row Office Visit from 06/14/2023 in [facility] Office Visit from 07[facility]17/2022 in [facility] Office Visit from 04/10/2022 in [facility] Office Visit from 11/21/2021 in [facility] Office Visit from 08/16/2021 in [facility]  PHQ-2 Total Score 6 2 3 2 1   PHQ-9 Total Score 19 11 11 11 13       Flowsheet Row Office Visit from 06/14/2023 in [facility] Video Visit from 12/31/2022 in [facility] Admission (Discharged) from 09/13/2022 in [facility]  C-SSRS RISK CATEGORY No Risk No Risk No  Risk        Assessment and Plan: Julie Harrison is a 72 year old Caucasian female who has a history of bipolar disorder, GAD, hypothyroidism was evaluated in office today.  Patient is currently grieving her mother's death as well as struggling with sleep, plan as noted below.  Plan Bipolar disorder type II in remission Wellbutrin XL 300 mg p.o. daily Continue Prozac 10 mg p.o. daily  GAD-improving Continue Prozac 10 mg p.o. daily-changed to tablet due to her history of GI problems.  Social anxiety disorder-stable Continue to monitor  Other insomnia-likely due to RLS-unstable Start Requip 0.25 mg p.o. nightly  Bereavement-unstable Provided supportive counseling. Provided information for Ms. Phyllis Ginger at [phone] to start CBT.  Patient to give therapist to call to schedule appointment.  Follow-up in clinic in 3 to 4 weeks or sooner if needed.   Collaboration of Care: Collaboration of Care: Referral or follow-up with counselor/therapist AEB patient encouraged to establish care with therapist.  Patient/Guardian was advised Release of Information must be obtained prior to any record release in order to collaborate their care with an outside provider. Patient/Guardian was advised if they have not already done so to contact the registration department to sign all necessary forms in order for [place] to release information regarding their care.   Consent: Patient/Guardian gives verbal consent for treatment and assignment of benefits for services provided during this visit. Patient/Guardian expressed understanding and agreed to proceed.   This note was generated in part or whole with voice recognition software. Voice recognition is usually quite accurate but there are transcription errors that can and very often do occur. I  apologize for any typographical errors that were not detected and corrected.    Jomarie Longs, MD 06/14/2023, 12:51 PM

## 2023-06-14 NOTE — Patient Instructions (Signed)
PLEASE CALL Julie Harrison - [phone] FOR THERAPY  Ropinirole Tablets What is this medication? ROPINIROLE (roe PIN i role) treats the symptoms of Parkinson disease. It works by acting like dopamine, a substance in your body that helps manage movements and coordination. This reduces the symptoms of Parkinson, such as body stiffness and tremors. It may also be used to treat restless legs syndrome (RLS). This medicine may be used for other purposes; ask your health care provider or pharmacist if you have questions. COMMON BRAND NAME(S): Requip What should I tell my care team before I take this medication? They need to know if you have any of these conditions: Heart disease High blood pressure Kidney disease Liver disease Low blood pressure Narcolepsy Sleep apnea Tobacco use An unusual or allergic reaction to ropinirole, other medications, foods, dyes, or preservatives Pregnant or trying to get pregnant Breast-feeding How should I use this medication? Take this medication by mouth with water. Take it as directed on the prescription label. You can take it with or without food. If it upsets your stomach, take it with food. If it upsets your stomach, take it with food. Keep taking this medication unless your care team tells you to stop. Stopping it too quickly can cause serious side effects. It can also make your condition worse. Talk to your care team about the use of this medication in children. Special care may be needed. Overdosage: If you think you have taken too much of this medicine contact a poison control center or emergency room at once. NOTE: This medicine is only for you. Do not share this medicine with others. What if I miss a dose? If you miss a dose, take it as soon as you can. If it is almost time for your next dose, take only that dose. Do not take double or extra doses. What may interact with this medication? Alcohol Antihistamines for allergy, cough and cold Certain  medications for depression, anxiety, or mental health conditions Certain medications for seizures, such as phenobarbital, primidone Certain medications for sleep Ciprofloxacin Estrogen or progestin hormones Fluvoxamine General anesthetics, such as halothane, isoflurane, methoxyflurane, propofol Medications for blood pressure Medications that relax muscles for surgery Metoclopramide Opioid medications for pain Rifampin Tobacco This list may not describe all possible interactions. Give your health care provider a list of all the medicines, herbs, non-prescription drugs, or dietary supplements you use. Also tell them if you smoke, drink alcohol, or use illegal drugs. Some items may interact with your medicine. What should I watch for while using this medication? Visit your care team for regular checks on your progress. Tell your care team if your symptoms do not start to get better or if they get worse. Do not suddenly stop taking this medication. You may develop a severe reaction. Your care team will tell you how much medication to take. If your care team wants you to stop the medication, the dose may be slowly lowered over time to avoid any side effects. This medication may affect your coordination, reaction time, or judgement. Do not drive or operate machinery until you know how this medication affects you. Sit up or stand slowly to reduce the risk of dizzy or fainting spells. Drinking alcohol with this medication can increase the risk of these side effects. When taking this medication, you may fall asleep without notice. You may be doing activities, such as driving a car, talking, or eating. You may not feel drowsy before it happens. Contact your care team  right away if this happens to you. There have been reports of increased sexual urges or other strong urges, such as gambling while taking this medication. If you experience any of these while taking this medication, you should report this to  your care team as soon as possible. Your mouth may get dry. Chewing sugarless gum or sucking hard candy and drinking plenty of water may help. Contact your care team if the problem does not go away or is severe. What side effects may I notice from receiving this medication? Side effects that you should report to your care team as soon as possible: Allergic reactions--skin rash, itching, hives, swelling of the face, lips, tongue, or throat Falling asleep during daily activities Low blood pressure--dizziness, feeling faint or lightheaded, blurry vision Mood and behavior changes--anxiety, nervousness, irritability and restlessness, confusion, hallucinations, feeling distrust or suspicion of others New or worsening uncontrolled and repetitive movements of the face, mouth, or upper body Slow heartbeat--dizziness, feeling faint or lightheaded, confusion, trouble breathing, unusual weakness or fatigue Urges to engage in impulsive behaviors such as gambling, binge eating, sexual activity, or shopping in ways that are unusual for you Side effects that usually do not require medical attention (report to your care team if they continue or are bothersome): Dizziness Drowsiness Nausea Swelling of the ankles, hands, or feet Unusual weakness or fatigue Upset stomach Vomiting This list may not describe all possible side effects. Call your doctor for medical advice about side effects. You may report side effects to FDA at 1-800-FDA-1088. Where should I keep my medication? Keep out of the reach of children and pets. Store at room temperature between 20 and 25 degrees C (68 and 77 degrees F). Protect from light and moisture. Keep the container tightly closed. Get rid of any unused medication after the expiration date. To get rid of medications that are no longer needed or have expired: Take the medication to a take-back program. Check with your pharmacy or law enforcement to find a location. If you cannot  return the medication, check the label or package insert to see if the medication should be thrown out in the garbage or flushed down the toilet. If you are not sure, ask your care team. If it is safe to put it in the trash, empty the medication out of the container. Mix the medication with cat litter, dirt, coffee grounds, or other unwanted substance. Seal the mixture in a bag or container. Put it in the trash. NOTE: This sheet is a summary. It may not cover all possible information. If you have questions about this medicine, talk to your doctor, pharmacist, or health care provider.  2024 Elsevier/Gold Standard (2022-01-10 00:00:00)

## 2023-07-10 ENCOUNTER — Other Ambulatory Visit: Payer: Self-pay | Admitting: Psychiatry

## 2023-07-10 DIAGNOSIS — G2581 Restless legs syndrome: Secondary | ICD-10-CM

## 2023-07-10 DIAGNOSIS — G4709 Other insomnia: Secondary | ICD-10-CM

## 2023-07-31 ENCOUNTER — Other Ambulatory Visit: Payer: Self-pay | Admitting: Psychiatry

## 2023-07-31 DIAGNOSIS — F3176 Bipolar disorder, in full remission, most recent episode depressed: Secondary | ICD-10-CM

## 2023-08-11 ENCOUNTER — Other Ambulatory Visit: Payer: Self-pay | Admitting: Psychiatry

## 2023-08-11 DIAGNOSIS — F3176 Bipolar disorder, in full remission, most recent episode depressed: Secondary | ICD-10-CM

## 2023-08-11 DIAGNOSIS — F411 Generalized anxiety disorder: Secondary | ICD-10-CM

## 2023-08-11 DIAGNOSIS — F4010 Social phobia, unspecified: Secondary | ICD-10-CM

## 2023-08-11 DIAGNOSIS — F4321 Adjustment disorder with depressed mood: Secondary | ICD-10-CM

## 2023-08-21 ENCOUNTER — Ambulatory Visit: Payer: Medicare Other | Admitting: Psychiatry

## 2023-10-03 ENCOUNTER — Other Ambulatory Visit: Payer: Self-pay | Admitting: Acute Care

## 2023-10-03 DIAGNOSIS — Z87891 Personal history of nicotine dependence: Secondary | ICD-10-CM

## 2023-10-03 DIAGNOSIS — Z122 Encounter for screening for malignant neoplasm of respiratory organs: Secondary | ICD-10-CM

## 2023-10-07 ENCOUNTER — Other Ambulatory Visit: Payer: Self-pay | Admitting: Gastroenterology

## 2023-10-07 DIAGNOSIS — K529 Noninfective gastroenteritis and colitis, unspecified: Secondary | ICD-10-CM

## 2023-11-18 ENCOUNTER — Ambulatory Visit: Admission: RE | Admit: 2023-11-18 | Payer: Medicare HMO | Source: Ambulatory Visit

## 2023-11-25 ENCOUNTER — Ambulatory Visit
Admission: RE | Admit: 2023-11-25 | Discharge: 2023-11-25 | Disposition: A | Discharge status: Home / Self Care | Payer: Medicare HMO | Source: Ambulatory Visit | Attending: Acute Care | Admitting: Acute Care

## 2023-11-25 DIAGNOSIS — Z87891 Personal history of nicotine dependence: Secondary | ICD-10-CM | POA: Diagnosis not present

## 2023-11-25 DIAGNOSIS — Z122 Encounter for screening for malignant neoplasm of respiratory organs: Secondary | ICD-10-CM | POA: Insufficient documentation

## 2023-12-01 ENCOUNTER — Other Ambulatory Visit: Payer: Self-pay | Admitting: Psychiatry

## 2023-12-01 DIAGNOSIS — F3176 Bipolar disorder, in full remission, most recent episode depressed: Secondary | ICD-10-CM

## 2023-12-01 DIAGNOSIS — F4010 Social phobia, unspecified: Secondary | ICD-10-CM

## 2023-12-01 DIAGNOSIS — F411 Generalized anxiety disorder: Secondary | ICD-10-CM

## 2023-12-01 DIAGNOSIS — F4321 Adjustment disorder with depressed mood: Secondary | ICD-10-CM

## 2023-12-02 ENCOUNTER — Telehealth: Payer: Self-pay | Admitting: Psychiatry

## 2023-12-02 NOTE — Telephone Encounter (Signed)
 Patient needs to schedule an appointment

## 2023-12-10 ENCOUNTER — Other Ambulatory Visit: Payer: Self-pay | Admitting: Acute Care

## 2023-12-10 DIAGNOSIS — Z87891 Personal history of nicotine dependence: Secondary | ICD-10-CM

## 2023-12-10 DIAGNOSIS — Z122 Encounter for screening for malignant neoplasm of respiratory organs: Secondary | ICD-10-CM

## 2024-01-08 DIAGNOSIS — E039 Hypothyroidism, unspecified: Secondary | ICD-10-CM | POA: Diagnosis not present

## 2024-01-08 DIAGNOSIS — K8681 Exocrine pancreatic insufficiency: Secondary | ICD-10-CM | POA: Diagnosis not present

## 2024-01-08 DIAGNOSIS — F331 Major depressive disorder, recurrent, moderate: Secondary | ICD-10-CM | POA: Diagnosis not present

## 2024-01-08 DIAGNOSIS — I10 Essential (primary) hypertension: Secondary | ICD-10-CM | POA: Diagnosis not present

## 2024-02-26 ENCOUNTER — Ambulatory Visit: Payer: Medicare HMO | Admitting: Gastroenterology

## 2024-02-26 VITALS — BP 127/78 | HR 65 | Temp 98.3°F | Wt 114.8 lb

## 2024-02-26 DIAGNOSIS — Z91018 Allergy to other foods: Secondary | ICD-10-CM | POA: Diagnosis not present

## 2024-02-26 DIAGNOSIS — K8681 Exocrine pancreatic insufficiency: Secondary | ICD-10-CM | POA: Diagnosis not present

## 2024-02-26 DIAGNOSIS — K529 Noninfective gastroenteritis and colitis, unspecified: Secondary | ICD-10-CM

## 2024-02-26 NOTE — Progress Notes (Signed)
 Karma Oz, MD [address]  [address]  [place], [place] [address]  Main: [phone]  Fax: [phone]    Gastroenterology Consultation  Referring Provider:     Rex Castor, MD Primary Care Physician:  Rex Castor, MD Primary Gastroenterologist:  Dr. Karma Oz Reason for Consultation: EPI and alpha gal deficiency   HPI:   Julie Harrison is a 73 y.o. female referred by Dr. Rex Castor, MD  for consultation & management of abdominal cramps and diarrhea.  Patient has several years history of postprandial urgency with loose stools associated with abdominal cramps, bloating.  She was originally evaluated by Dr. Corky Diener at [facility], underwent upper endoscopy as well as colonoscopy which were unremarkable except for mild erosive esophagitis.  Random colon biopsies were normal.  Patient felt like her symptoms were not addressed and she followed up at [facility].  She underwent sits marker study at [facility] which revealed moderate stool burden and retention of sitz markers.  Subsequently, she was recommended to undergo anorectal manometry and she did not pursue it at that time.  Patient does have history of anxiety and she notices anxiety triggers her GI symptoms.  She does drink sodas, sweet tea regularly as well as consumes red meat few times a week.  Her weight has been stable.  Her labs including CBC, CMP were unremarkable, normal HbA1c.  She does have history of hypothyroidism, on thyroid replacement therapy  Patient does not smoke or drink alcohol Ex tobacco use  Follow-up visit 08/01/2022 Patient is here for follow up of chronic diarrhea, abdominal bloating.  Patient reports that she has been experiencing intermittent episodes of abdominal cramps with loose stools depending on what she eats.  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  When she is needed these food products, she felt better.  When she reintroduced,  she noticed recurrence of symptoms.  She is also interested to undergo repeat testing for EPI.  Follow-up visit 05/06/2023 Patient is here for follow-up of EPI.  Repeat pancreatic fecal elastase levels came back low.  I recommended her to start taking pancreatic enzymes which she has been but not consistently.  She recently lost her mom who was 87 years old in May and has been a big adjustment for her.  She lost about 10 pounds since she passed away, for last few weeks, her weight has been stable.  She does notice improvement in her symptoms when she is consistently taking pancreatic enzymes.  She does not have major concerns today.  Follow-up visit 02/26/2024 Julie Harrison is here for follow-up of chronic diarrhea.  She also has alpha gal deficiency.  Her weight has been stable.  She was going through significant stress around the time when she was losing weight, her mother passed away at that time.  In 2023-11-06, her husband was in hospice and she could not take care of herself much.  Fortunately he is doing better.  She reports that her diarrheal episodes have significantly reduced, having 1 episode a week, takes Imodium as needed.  She is also taking Creon.  She also notices exposure to red meat worsens her diarrhea.  She denies any abdominal bloating, nausea or vomiting.  Her appetite is good.  She is trying to follow a lactose-free diet as well.   NSAIDs: None  Antiplts/Anticoagulants/Anti thrombotics: None  GI Procedures:   Colonoscopy 09/13/2022 Normal terminal ileum Granular distal rectum biopsy Normal  mucosa in the right and left colon, biopsied Severe left colon diverticulosis DIAGNOSIS:  A. COLON, RANDOM; COLD BIOPSY:  - BENIGN COLONIC MUCOSA WITH NO SIGNIFICANT HISTOPATHOLOGIC CHANGE.  - NEGATIVE FOR ACTIVE MUCOSAL COLITIS AND FEATURES OF MICROSCOPIC  COLITIS.  - NEGATIVE FOR DYSPLASIA AND MALIGNANCY.   B. RECTUM, DISTAL; COLD BIOPSY:  - BENIGN RECTAL MUCOSA WITH ULCERATION, ACTIVE  INFLAMMATION, AND FOCAL  MUCOSAL PROLAPSE TYPE CHANGES.  - NEGATIVE FOR DYSPLASIA AND MALIGNANCY.   -Colonoscopy:08/05/2017 - Normal random colon bx, TA polyp x 1, diverticulosis, internal hemorroids - EGD: 08/05/2017 - Gastritis, no h pylori, LA Grade A esophagitis  Sits marker study at [facility] 12/16/2019 FINDINGS:  Thirty-one Sitz markers are noted throughout the colon. The distal  most marker is likely at the level of the distal sigmoid colon. The  bowel gas pattern is nonobstructive. There is a moderate amount of  stool throughout the colon. There are degenerative changes of the  visualized thoracolumbar spine and bilateral hips, left worse than  right.   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 [facility])    Personal history of colonic polyps    Thyroid disease    Tobacco dependence    Vitamin D deficiency     Past Surgical History:  Procedure Laterality Date   COLONOSCOPY WITH PROPOFOL  N/A 09/13/2022   Procedure: COLONOSCOPY WITH PROPOFOL ;  Surgeon: Selena Daily, MD;  Location: [facility] ENDOSCOPY;  Service: Gastroenterology;  Laterality: N/A;    Current Outpatient Medications:    buPROPion  (WELLBUTRIN  XL) 300 MG 24 hr tablet, TAKE 1 TABLET BY MOUTH EVERY DAY, Disp: 90 tablet, Rfl: 1   calcium carbonate (SUPER CALCIUM) 1500 (600 Ca) MG TABS tablet, Take 600 mg of elemental calcium by mouth daily with breakfast., Disp: , Rfl:    Cholecalciferol 10 MCG (400 UNIT) CHEW, Chew by mouth., Disp: , Rfl:    dicyclomine  (BENTYL ) 10 MG capsule, TAKE 1 CAPSULE (10 MG TOTAL) BY MOUTH EVERY 6 (SIX) HOURS AS NEEDED FOR SPASMS., Disp: 30 capsule, Rfl: 0   FLUoxetine  (PROZAC ) 10 MG capsule, TAKE 1 CAPSULE BY MOUTH EVERY DAY, Disp: 90 capsule, Rfl: 1   levothyroxine (SYNTHROID) 75 MCG tablet, Take 75 mcg by mouth every morning., Disp: , Rfl:    lisinopril (ZESTRIL) 10 MG tablet, Take 10 mg by mouth  daily., Disp: , Rfl:    lisinopril (ZESTRIL) 20 MG tablet, Take 20 mg by mouth daily., Disp: , Rfl:    loratadine (CLARITIN) 10 MG tablet, Take 10 mg by mouth daily., Disp: , Rfl:    Multiple Vitamin (MULTI-VITAMIN) tablet, Take 1 tablet by mouth daily., Disp: , Rfl:    Pancrelipase , Lip-Prot-Amyl, (ZENPEP ) 40000-126000 units CPEP, Take 2 capsules with the first bite of each meal and 1 capsule with the first bite of each snack, Disp: 240 capsule, Rfl: 0   pantoprazole (PROTONIX) 40 MG tablet, Take 40 mg by mouth daily., Disp: , Rfl:    rOPINIRole  (REQUIP ) 0.25 MG tablet, TAKE 1 TABLET BY MOUTH AT BEDTIME., Disp: 90 tablet, Rfl: 1   simvastatin (ZOCOR) 40 MG tablet, Take 40 mg by mouth at bedtime., Disp: , Rfl:    triamcinolone cream (KENALOG) 0.1 %, Apply 1 Application topically 2 (two) times daily., Disp: , Rfl:    ezetimibe (ZETIA) 10 MG tablet, Take 1 tablet by mouth daily., Disp: , Rfl:    Family History  Problem Relation Age  of Onset   Depression Mother    Alcohol abuse Father    Breast cancer Maternal Aunt 62   Alcohol abuse Paternal Uncle    Depression Maternal Grandmother    Breast cancer Maternal Grandmother 87   Drug abuse Cousin      Social History   Tobacco Use   Smoking status: Former    Current packs/day: 0.00    Types: Cigarettes    Quit date: 03/22/2018    Years since quitting: 5.9   Smokeless tobacco: Never  Vaping Use   Vaping status: Former  Substance Use Topics   Alcohol use: Yes    Alcohol/week: 2.0 standard drinks of alcohol    Types: 2 Glasses of wine per week   Drug use: Yes    Types: Marijuana    Comment: about a month ago    Allergies as of 02/26/2024 - Review Complete 02/26/2024  Allergen Reaction Noted   Hydralazine hcl Itching 07/12/2017   Erythromycin Nausea Only 11/08/2016   Hydralazine Itching 07/12/2017   Hydrochlorothiazide Itching 11/08/2016   Vortioxetine Nausea Only 11/08/2016    Review of Systems:    All systems reviewed and  negative except where noted in HPI.   Physical Exam:  BP 127/78 (BP Location: Left Arm, Patient Position: Sitting, Cuff Size: Normal)   Pulse 65   Temp 98.3 F (36.8 C) (Oral)   Wt 114 lb 12.8 oz (52.1 kg)   BMI 23.19 kg/m  No LMP recorded. Patient is postmenopausal.  General:   Alert,  Well-developed, well-nourished, pleasant and cooperative in NAD Head:  Normocephalic and atraumatic. Eyes:  Sclera clear, no icterus.   Conjunctiva pink. Ears:  Normal auditory acuity. Nose:  No deformity, discharge, or lesions. Mouth:  No deformity or lesions,oropharynx pink & moist. Neck:  Supple; no masses or thyromegaly. Lungs:  Respirations even and unlabored.  Clear throughout to auscultation.   No wheezes, crackles, or rhonchi. No acute distress. Heart:  Regular rate and rhythm; no murmurs, clicks, rubs, or gallops. Abdomen:  Normal bowel sounds. Soft, non-tender and nondistended without masses, hepatosplenomegaly or hernias noted.  No guarding or rebound tenderness.   Rectal: Not performed Msk:  Symmetrical without gross deformities. Good, equal movement & strength bilaterally. Pulses:  Normal pulses noted. Extremities:  No clubbing or edema.  No cyanosis. Neurologic:  Alert and oriented x3;  grossly normal neurologically. Skin:  Intact without significant lesions or rashes. No jaundice. Psych:  Alert and cooperative. Normal mood and affect.  Imaging Studies: Reviewed  Assessment and Plan:   Julie Harrison is a 73 y.o. pleasant Caucasian female with history of anxiety, depression, history of hypothyroidism, hypertension, history of tobacco use on Wellbutrin  is seen in consultation for chronic symptoms of abdominal bloating, abdominal cramps as well as postprandial urgency, loose stools without rectal bleeding. EGD and colonoscopy were unremarkable, no evidence of H. pylori, random colon biopsies were normal.  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  Food allergy  profile was positive for milk allergy, alpha gal panel was positive for elevated IgE levels to beef, pork and lab.  Patient reports history of Lyme's disease several years ago after a tick bite.  Her symptoms have overall improved  Chronic diarrhea and abdominal bloating secondary to alpha gal and milk allergy, exocrine pancreatic insufficiency Her symptoms responded to pancreatic enzyme replacement therapy Reiterated about adherence to pancreatic enzymes, avoidance of beef, lamb and deer meat Continue lactose-free diet [facility] samples were provided    Follow up in  6 months   Karma Oz, MD

## 2024-05-13 ENCOUNTER — Other Ambulatory Visit: Payer: Self-pay | Admitting: Internal Medicine

## 2024-05-13 DIAGNOSIS — Z1231 Encounter for screening mammogram for malignant neoplasm of breast: Secondary | ICD-10-CM

## 2024-05-19 DIAGNOSIS — F4323 Adjustment disorder with mixed anxiety and depressed mood: Secondary | ICD-10-CM | POA: Diagnosis not present

## 2024-06-01 ENCOUNTER — Ambulatory Visit

## 2024-06-04 ENCOUNTER — Encounter

## 2024-06-10 ENCOUNTER — Ambulatory Visit
Admission: RE | Admit: 2024-06-10 | Discharge: 2024-06-10 | Disposition: A | Discharge status: Home / Self Care | Source: Ambulatory Visit | Attending: Internal Medicine | Admitting: Internal Medicine

## 2024-06-10 DIAGNOSIS — Z1231 Encounter for screening mammogram for malignant neoplasm of breast: Secondary | ICD-10-CM | POA: Diagnosis not present

## 2024-08-24 ENCOUNTER — Ambulatory Visit: Admitting: Family
